# Patient Record
Sex: Female | Born: 1965 | Race: White | Hispanic: No | Marital: Single | Smoking: Current every day smoker
Health system: Southern US, Community
[De-identification: ages and names within clinical notes are randomized; demographics above are authoritative.]

## PROBLEM LIST (undated history)

## (undated) DIAGNOSIS — K219 Gastro-esophageal reflux disease without esophagitis: Secondary | ICD-10-CM

## (undated) DIAGNOSIS — E079 Disorder of thyroid, unspecified: Secondary | ICD-10-CM

## (undated) DIAGNOSIS — K625 Hemorrhage of anus and rectum: Secondary | ICD-10-CM

## (undated) DIAGNOSIS — Z86010 Personal history of colonic polyps: Secondary | ICD-10-CM

## (undated) DIAGNOSIS — J988 Other specified respiratory disorders: Secondary | ICD-10-CM

## (undated) HISTORY — PX: COLON SURGERY: SHX602

## (undated) HISTORY — DX: Disorder of thyroid, unspecified: E07.9

## (undated) HISTORY — DX: Hemorrhage of anus and rectum: K62.5

## (undated) HISTORY — DX: Personal history of colonic polyps: Z86.010

## (undated) HISTORY — PX: TUBAL LIGATION: SHX77

---

## 1995-08-09 HISTORY — PX: OOPHORECTOMY: SHX86

## 1998-08-28 ENCOUNTER — Other Ambulatory Visit: Admission: RE | Admit: 1998-08-28 | Discharge: 1998-08-28 | Payer: Self-pay | Admitting: Obstetrics and Gynecology

## 1999-08-30 ENCOUNTER — Other Ambulatory Visit: Admission: RE | Admit: 1999-08-30 | Discharge: 1999-08-30 | Payer: Self-pay | Admitting: Obstetrics and Gynecology

## 2000-08-18 ENCOUNTER — Other Ambulatory Visit: Admission: RE | Admit: 2000-08-18 | Discharge: 2000-08-18 | Payer: Self-pay | Admitting: Obstetrics and Gynecology

## 2001-09-04 ENCOUNTER — Other Ambulatory Visit: Admission: RE | Admit: 2001-09-04 | Discharge: 2001-09-04 | Payer: Self-pay | Admitting: Obstetrics and Gynecology

## 2002-09-10 ENCOUNTER — Other Ambulatory Visit: Admission: RE | Admit: 2002-09-10 | Discharge: 2002-09-10 | Payer: Self-pay | Admitting: Obstetrics and Gynecology

## 2003-09-15 ENCOUNTER — Other Ambulatory Visit: Admission: RE | Admit: 2003-09-15 | Discharge: 2003-09-15 | Payer: Self-pay | Admitting: Obstetrics and Gynecology

## 2004-09-21 ENCOUNTER — Other Ambulatory Visit: Admission: RE | Admit: 2004-09-21 | Discharge: 2004-09-21 | Payer: Self-pay | Admitting: Obstetrics and Gynecology

## 2004-09-23 ENCOUNTER — Encounter: Admission: RE | Admit: 2004-09-23 | Discharge: 2004-09-23 | Payer: Self-pay | Admitting: Obstetrics and Gynecology

## 2005-09-21 ENCOUNTER — Other Ambulatory Visit: Admission: RE | Admit: 2005-09-21 | Discharge: 2005-09-21 | Payer: Self-pay | Admitting: Obstetrics and Gynecology

## 2006-09-27 ENCOUNTER — Encounter: Admission: RE | Admit: 2006-09-27 | Discharge: 2006-09-27 | Payer: Self-pay | Admitting: Obstetrics and Gynecology

## 2007-10-03 ENCOUNTER — Encounter: Admission: RE | Admit: 2007-10-03 | Discharge: 2007-10-03 | Payer: Self-pay | Admitting: Obstetrics and Gynecology

## 2008-10-02 ENCOUNTER — Encounter: Admission: RE | Admit: 2008-10-02 | Discharge: 2008-10-02 | Payer: Self-pay | Admitting: Obstetrics and Gynecology

## 2009-10-07 ENCOUNTER — Encounter: Admission: RE | Admit: 2009-10-07 | Discharge: 2009-10-07 | Payer: Self-pay | Admitting: Obstetrics and Gynecology

## 2010-09-02 ENCOUNTER — Other Ambulatory Visit: Payer: Self-pay | Admitting: Obstetrics and Gynecology

## 2010-09-02 DIAGNOSIS — Z1239 Encounter for other screening for malignant neoplasm of breast: Secondary | ICD-10-CM

## 2010-10-13 ENCOUNTER — Ambulatory Visit
Admission: RE | Admit: 2010-10-13 | Discharge: 2010-10-13 | Disposition: A | Discharge status: Home / Self Care | Payer: Private Health Insurance - Indemnity | Source: Ambulatory Visit | Attending: Obstetrics and Gynecology | Admitting: Obstetrics and Gynecology

## 2010-10-13 DIAGNOSIS — Z1239 Encounter for other screening for malignant neoplasm of breast: Secondary | ICD-10-CM

## 2011-02-18 ENCOUNTER — Encounter (INDEPENDENT_AMBULATORY_CARE_PROVIDER_SITE_OTHER): Payer: Self-pay | Admitting: General Surgery

## 2011-02-18 ENCOUNTER — Ambulatory Visit (INDEPENDENT_AMBULATORY_CARE_PROVIDER_SITE_OTHER): Payer: Private Health Insurance - Indemnity | Admitting: General Surgery

## 2011-02-18 VITALS — BP 122/88 | HR 64 | Temp 97.8°F | Ht 67.0 in | Wt 141.0 lb

## 2011-02-18 DIAGNOSIS — K6389 Other specified diseases of intestine: Secondary | ICD-10-CM

## 2011-02-18 NOTE — Progress Notes (Signed)
Subjective:     Patient ID: Kendra Fuller, female   DOB: 1965/10/22, 45 y.o.   MRN: [id number]    BP 122/88  Pulse 64  Temp(Src) 97.8 F (36.6 C) (Temporal)  Ht 5\' 7"  (1.702 m)  Wt 141 lb (63.957 kg)  BMI 22.08 kg/m2    HPI This patient is referred for evaluation of a sigmoid colon mass which was found on recent colonoscopy. She has complained of 7 months of rectal bleeding which she states is bright red blood on the stool but has recently been increased last 3 months. She was evaluated by Dr. Bosie Clos at [facility] who performed a colonoscopy on February 11, 2011 which demonstrated a large sigmoid mass at approximately 35 cm. 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 She was referred for surgical evaluation and removal of this mass which was unresectable by endoscopy. She denies any abdominal pain, weight loss, or family history of colon cancer. She states that her bowels have otherwise been normal and is normally approximately one bowel movement per day with some occasional episodes of constipation. She denies any nausea, vomiting, fevers, or chills or other constitutional symptoms.  No Known Allergies  Past Medical History  Diagnosis Date  . Rectal bleeding     Past Surgical History  Procedure Date  . Oophorectomy   . Tubal ligation     left fallopian tube removed    History  Substance Use Topics  . Smoking status: Current Everyday Smoker -- 1.0 packs/day    Types: Cigarettes  . Smokeless tobacco: Current User  . Alcohol Use: Yes    Family History  Problem Relation Age of Onset  . Cancer Mother     lung cancer  . Heart disease Father      Review of Systems  Constitutional: Negative.   Eyes: Negative.   Respiratory: Negative.   Cardiovascular: Negative.   Gastrointestinal: Positive for blood in stool and anal bleeding.  Genitourinary: Negative.   Musculoskeletal: Negative.   Skin: Negative.     Neurological: Negative.   Hematological: Negative.   Psychiatric/Behavioral: Negative.        Objective:   Physical Exam  Constitutional: She is oriented to person, place, and time. She appears well-developed and well-nourished. No distress.  HENT:  Head: Normocephalic and atraumatic.  Mouth/Throat: No oropharyngeal exudate.  Eyes: Conjunctivae and EOM are normal. Pupils are equal, round, and reactive to light. Right eye exhibits no discharge. Left eye exhibits no discharge. No scleral icterus.  Neck: Normal range of motion. Neck supple. No tracheal deviation present.  Cardiovascular: Normal rate, regular rhythm, normal heart sounds and intact distal pulses.   Pulmonary/Chest: Effort normal and breath sounds normal. No stridor. No respiratory distress. She has no wheezes. She has no rales.  Abdominal: Soft. Bowel sounds are normal. She exhibits no distension and no mass. There is no tenderness. There is no rebound and no guarding.  Genitourinary:       Rectal with some external skin tags, hemorrhoids.  Good rectal tone, no internal masses  Musculoskeletal: Normal range of motion. She exhibits no edema and no tenderness.  Neurological: She is oriented to person, place, and time. She has normal reflexes.  Skin: Skin is warm and dry. No rash noted. She is not diaphoretic. No erythema. No pallor.  Psychiatric: She has a normal mood and affect. Her behavior is normal. Judgment and thought content normal.       Assessment:  Rectal bleeding with Colon mass    Plan:     I discussed with her the pathology results and reviewed her endoscopy images showing the adenomatous polyp. This does appear to be too large for endoscopic resection and I agree with the plan for formal colon resection. We discussed the options of open versus laparoscopic procedure and if her anatomy is amenable to colonoscopic repair this would probably be best for her. However, she does have a lower midline scar from  her prior surgery which may limit our ability for the laparoscopic procedure. I discussed with her the risks of infection, bleeding, scar, anastomotic leaks, ureteral injury, injury to spleen or surrounding structures, need for open surgery, possible need for ostomy. She expressed understanding and desires to proceed with the planned procedure. Prior to her procedure we will give her 2 fleets enemas the morning of her surgery. I have also counseled her to start liquids for 2 days prior to surgery. We will also obtain laboratory studies including a CEA as well as a CT of the abdomen.

## 2011-02-23 ENCOUNTER — Ambulatory Visit (HOSPITAL_COMMUNITY)
Admission: RE | Admit: 2011-02-23 | Discharge: 2011-02-23 | Disposition: A | Discharge status: Home / Self Care | Payer: Managed Care, Other (non HMO) | Source: Ambulatory Visit | Attending: General Surgery | Admitting: General Surgery

## 2011-02-23 DIAGNOSIS — K6389 Other specified diseases of intestine: Secondary | ICD-10-CM | POA: Insufficient documentation

## 2011-02-23 DIAGNOSIS — R933 Abnormal findings on diagnostic imaging of other parts of digestive tract: Secondary | ICD-10-CM | POA: Insufficient documentation

## 2011-02-23 MED ORDER — IOHEXOL 300 MG/ML  SOLN
100.0000 mL | Freq: Once | INTRAMUSCULAR | Status: AC | PRN
  Administered 2011-02-23: 100 mL via INTRAVENOUS

## 2011-02-25 ENCOUNTER — Other Ambulatory Visit (INDEPENDENT_AMBULATORY_CARE_PROVIDER_SITE_OTHER): Payer: Self-pay | Admitting: General Surgery

## 2011-02-25 DIAGNOSIS — K6389 Other specified diseases of intestine: Secondary | ICD-10-CM

## 2011-02-25 DIAGNOSIS — K769 Liver disease, unspecified: Secondary | ICD-10-CM

## 2011-03-02 ENCOUNTER — Other Ambulatory Visit (INDEPENDENT_AMBULATORY_CARE_PROVIDER_SITE_OTHER): Payer: Self-pay | Admitting: General Surgery

## 2011-03-02 ENCOUNTER — Encounter (HOSPITAL_COMMUNITY)
Admission: RE | Admit: 2011-03-02 | Discharge: 2011-03-02 | Disposition: A | Discharge status: Home / Self Care | Payer: Managed Care, Other (non HMO) | Source: Ambulatory Visit | Attending: General Surgery | Admitting: General Surgery

## 2011-03-02 ENCOUNTER — Ambulatory Visit (HOSPITAL_COMMUNITY)
Admission: RE | Admit: 2011-03-02 | Discharge: 2011-03-02 | Disposition: A | Discharge status: Home / Self Care | Payer: Managed Care, Other (non HMO) | Source: Ambulatory Visit | Attending: General Surgery | Admitting: General Surgery

## 2011-03-02 DIAGNOSIS — K6389 Other specified diseases of intestine: Secondary | ICD-10-CM

## 2011-03-02 DIAGNOSIS — Z01812 Encounter for preprocedural laboratory examination: Secondary | ICD-10-CM | POA: Insufficient documentation

## 2011-03-02 DIAGNOSIS — D126 Benign neoplasm of colon, unspecified: Secondary | ICD-10-CM | POA: Insufficient documentation

## 2011-03-02 DIAGNOSIS — K769 Liver disease, unspecified: Secondary | ICD-10-CM

## 2011-03-02 DIAGNOSIS — K7689 Other specified diseases of liver: Secondary | ICD-10-CM | POA: Insufficient documentation

## 2011-03-02 DIAGNOSIS — K639 Disease of intestine, unspecified: Secondary | ICD-10-CM | POA: Insufficient documentation

## 2011-03-02 DIAGNOSIS — Z01810 Encounter for preprocedural cardiovascular examination: Secondary | ICD-10-CM | POA: Insufficient documentation

## 2011-03-02 DIAGNOSIS — Z01818 Encounter for other preprocedural examination: Secondary | ICD-10-CM | POA: Insufficient documentation

## 2011-03-02 LAB — SURGICAL PCR SCREEN
MRSA, PCR: NEGATIVE
Staphylococcus aureus: NEGATIVE

## 2011-03-02 LAB — CBC
HCT: 42 % (ref 36.0–46.0)
Hemoglobin: 14.8 g/dL (ref 12.0–15.0)
MCH: 30.5 pg (ref 26.0–34.0)
MCHC: 35.2 g/dL (ref 30.0–36.0)
MCV: 86.6 fL (ref 78.0–100.0)
Platelets: 246 10*3/uL (ref 150–400)
RBC: 4.85 MIL/uL (ref 3.87–5.11)
RDW: 12.9 % (ref 11.5–15.5)
WBC: 11.2 10*3/uL — ABNORMAL HIGH (ref 4.0–10.5)

## 2011-03-02 LAB — COMPREHENSIVE METABOLIC PANEL
ALT: 13 U/L (ref 0–35)
AST: 17 U/L (ref 0–37)
Albumin: 3.9 g/dL (ref 3.5–5.2)
Alkaline Phosphatase: 77 U/L (ref 39–117)
BUN: 6 mg/dL (ref 6–23)
CO2: 27 mEq/L (ref 19–32)
Calcium: 9.4 mg/dL (ref 8.4–10.5)
Chloride: 102 mEq/L (ref 96–112)
Creatinine, Ser: 0.57 mg/dL (ref 0.50–1.10)
GFR calc Af Amer: >60 mL/min (ref >60)
GFR calc non Af Amer: >60 mL/min (ref >60)
Glucose, Bld: 90 mg/dL (ref 70–99)
Potassium: 3.7 mEq/L (ref 3.5–5.1)
Sodium: 137 mEq/L (ref 135–145)
Total Bilirubin: 0.2 mg/dL — ABNORMAL LOW (ref 0.3–1.2)
Total Protein: 7.1 g/dL (ref 6.0–8.3)

## 2011-03-02 LAB — DIFFERENTIAL
Basophils Absolute: 0 10*3/uL (ref 0.0–0.1)
Basophils Relative: 0 % (ref 0–1)
Eosinophils Absolute: 0.2 10*3/uL (ref 0.0–0.7)
Eosinophils Relative: 2 % (ref 0–5)
Lymphocytes Relative: 24 % (ref 12–46)
Lymphs Abs: 2.7 10*3/uL (ref 0.7–4.0)
Monocytes Absolute: 0.9 10*3/uL (ref 0.1–1.0)
Monocytes Relative: 8 % (ref 3–12)
Neutro Abs: 7.3 10*3/uL (ref 1.7–7.7)
Neutrophils Relative %: 65 % (ref 43–77)

## 2011-03-02 LAB — PROTIME-INR
INR: 0.93 (ref 0.00–1.49)
Prothrombin Time: 12.7 seconds (ref 11.6–15.2)

## 2011-03-02 LAB — CEA: CEA: 3.1 ng/mL (ref 0.0–5.0)

## 2011-03-02 LAB — HCG, SERUM, QUALITATIVE: Preg, Serum: NEGATIVE

## 2011-03-02 LAB — APTT: aPTT: 28 seconds (ref 24–37)

## 2011-03-02 MED ORDER — GADOBENATE DIMEGLUMINE 529 MG/ML IV SOLN
14.0000 mL | Freq: Once | INTRAVENOUS | Status: AC | PRN
  Administered 2011-03-02: 14 mL via INTRAVENOUS

## 2011-03-08 ENCOUNTER — Other Ambulatory Visit (INDEPENDENT_AMBULATORY_CARE_PROVIDER_SITE_OTHER): Payer: Self-pay | Admitting: General Surgery

## 2011-03-08 ENCOUNTER — Inpatient Hospital Stay (HOSPITAL_COMMUNITY)
Admission: RE | Admit: 2011-03-08 | Discharge: 2011-03-12 | DRG: 331 | Disposition: A | Discharge status: Home / Self Care | Payer: Managed Care, Other (non HMO) | Source: Ambulatory Visit | Attending: General Surgery | Admitting: General Surgery

## 2011-03-08 DIAGNOSIS — D126 Benign neoplasm of colon, unspecified: Principal | ICD-10-CM | POA: Diagnosis present

## 2011-03-08 DIAGNOSIS — F17200 Nicotine dependence, unspecified, uncomplicated: Secondary | ICD-10-CM | POA: Diagnosis present

## 2011-03-08 HISTORY — PX: LAPAROSCOPIC SIGMOID COLECTOMY: SHX5928

## 2011-03-09 LAB — BASIC METABOLIC PANEL
BUN: 6 mg/dL (ref 6–23)
CO2: 25 mEq/L (ref 19–32)
Calcium: 8 mg/dL — ABNORMAL LOW (ref 8.4–10.5)
Chloride: 105 mEq/L (ref 96–112)
Creatinine, Ser: 0.49 mg/dL — ABNORMAL LOW (ref 0.50–1.10)
GFR calc Af Amer: >60 mL/min (ref >60)
GFR calc non Af Amer: >60 mL/min (ref >60)
Glucose, Bld: 99 mg/dL (ref 70–99)
Potassium: 4 mEq/L (ref 3.5–5.1)
Sodium: 138 mEq/L (ref 135–145)

## 2011-03-09 LAB — CBC
HCT: 32.9 % — ABNORMAL LOW (ref 36.0–46.0)
Hemoglobin: 11.4 g/dL — ABNORMAL LOW (ref 12.0–15.0)
MCH: 30.6 pg (ref 26.0–34.0)
MCHC: 34.7 g/dL (ref 30.0–36.0)
MCV: 88.2 fL (ref 78.0–100.0)
Platelets: 174 10*3/uL (ref 150–400)
RBC: 3.73 MIL/uL — ABNORMAL LOW (ref 3.87–5.11)
RDW: 13.3 % (ref 11.5–15.5)
WBC: 15.1 10*3/uL — ABNORMAL HIGH (ref 4.0–10.5)

## 2011-03-09 LAB — HEMOGLOBIN AND HEMATOCRIT, BLOOD
HCT: 33.4 % — ABNORMAL LOW (ref 36.0–46.0)
Hemoglobin: 11.5 g/dL — ABNORMAL LOW (ref 12.0–15.0)

## 2011-03-19 NOTE — Op Note (Signed)
[name], FLIGHT                ACCOUNT NO.:  [id number]  MEDICAL RECORD NO.:  [id number]  LOCATION:  5155                         FACILITY:  [facility]  PHYSICIAN:  Lodema Pilot, MD       DATE OF BIRTH:  Oct 09, 1965  DATE OF PROCEDURE:  03/08/2011 DATE OF DISCHARGE:                              OPERATIVE REPORT   PROCEDURE:  Laparoscopic left colectomy.  PREOPERATIVE DIAGNOSIS:  Colon mass.  POSTOPERATIVE DIAGNOSIS:  Colon mass.  SURGEON:  Lodema Pilot, MD  ASSISTANTS: 1. Almond Lint, MD 2. Dr. Andrey Campanile.  ANESTHESIA:  General endotracheal anesthesia with 30 mL of 1% lidocaine with epinephrine and 0.25% Marcaine injected at 50/50 mixture.  FLUIDS:  3400 mL of the crystalloid, 1000 mL of colloid.  ESTIMATED BLOOD LOSS:  150 mL.  DRAINS:  None.  SPECIMENS:  Splenic flexure of the colon.  COMPLICATIONS:  None apparent.  FINDINGS:  Splenic flexure tattoo with underlying splenic flexure colon mass, primary side-to-side functional end-to-end anastomosis, a stapled anastomosis.  No evidence of liver lesion.  INDICATIONS FOR PROCEDURE:  Kendra Fuller is a 45 year old female who underwent a colonoscopy for her rectal bleeding.  She was found to have unresectable colon mass with pathology consistent with an adenomatous polyp which was tattooed and felt to be around 35 cm on the scope.  She was consented for laparoscopic sigmoid colectomy for removal of the polyp.  OPERATIVE DETAILS:  Kendra Fuller was seen and evaluated in the preoperative area and risks and benefits of the procedure were again discussed in lay terms.  Informed consent was obtained.  She had been on a liquid diet for 2 days and no bowel prep was administered.  She was given prophylactic antibiotics as well as Lovenox preoperatively.  She was taken to the operating room, placed on the table in supine position, and general endotracheal anesthesia was obtained.  Her right arm was prepped and her legs were placed in  Yellowfin stirrups, and Foley catheter was placed. Her abdomen was prepped and draped in a standard surgical fashion.  A supraumbilical midline incision was made in the skin and dissection was carried down to the abdominal wall fascia using blunt dissection and the abdominal fascia was sharply incised, the peritoneum was elevated and sharply incised and a 12-mm Hasson trocar was placed into the abdomen.  Pneumoperitoneum was obtained and laparoscope was introduced.  She did not have any evidence of liver lesions and the visible portions of the liver.  She did have some lower midline adhesions to the abdominal wall from her prior pelvic surgery and a 5-mm left lower quadrant trocar was placed under direct visualization.  Using this trocar, adhesions were taken down using sharp dissection and some of the omentum was taken down using a LigaSure device.  There was a loop of bowel which was closely adherent to the abdominal wall which was taken down and so another 5-mm right lower quadrant trocar was placed under direct visualization to allow for retraction and lysis of adhesions.  Adhesions were taken down and the underlying bowel was inspected for any injury, and there was some serosal cautery artifact on the serosa of the loop of small  bowel and one small spot in this and there was no evidence of bowel injury.  I oversewed this area with three interrupted silk Lembert sutures later in the procedure prophylactically.  With the adhesions all taken down, I placed a 12-mm trocar through her prior midline incision in the suprapubic region and the patient was positioned and the left colon was inspected for a tattoo.  It appeared to be much higher than the sigmoid colon and that was near the splenic flexure but there was a clear tattoo in this region.  The left colic vessels were identified through the mesenteric and the peritoneum was taken down over these vessels as they took off from the  inferior mesenteric artery.  The left colic and splenic arteries were identified, and they were skeletonized near their origins.  Prior to dividing any vascular structures, the left ureter was identified in the standard position and with the ureter identified, I felt comfortable dividing the rest of the left colic artery which was supplying the area of the tumor.  I felt that the sigmoid vessels were not necessary to divide in order to maintain blood flow to her distal colon and allow for perfusion at her anastomosis.  I divided the left colic branches using the LigaSure device and then blunt dissection was performed superficial to the Toldt fascia.  We dissected up to the mesentery using blunt dissection, staying above the kidney.  The tail of the pancreas was identified and after mesenteric dissection was performed in a standard medial collateral fashion, I felt that it will be started on the lateral dissection taking it down the peritoneum along the white line of Toldt using a sharp dissection and a minimal amount of Bovie electrocautery.  The colon was mobilized well from lateral to medial fashion, however, the splenic flexure was very cephalad and was adherent to the hilum of the spleen.  LigaSure was used to divide the colonic attachments to the spleen and the omentum was taken off the hepatic flexure and transverse colon using sharp dissection and LigaSure device, taking care to avoid injury to the colon.  After we felt that we had adequate medial mobilization of hepatic flexure and sufficient amount of colon for anastomosis after our resected specimen, I enlarged the supraumbilical trocar site to allow for a wound retractor and the colon was pulled up into the wound, however, there were some attachments still in the area of the splenic flexure which prevented adequate mobilization in order to do the resection and anastomosis, so the GelPOINT was placed through this incision and  further mobilization of the splenic flexure was performed.  The mesentery was divided up to the middle colic artery and after further mobilization, the splenic flexure of the colon was again exteriorized at this time, allowing for adequate mobilization.  A mesenteric window was created at least 5 cm from the palpable tumor and distal margin of the colon was transected with the GIA-75 blue load stapler.  The proximal colon was divided again at 5 cm from mass using another blue load GIA-75 stapler and the mesentery was already nearly divided which had been done laparoscopically but LigaSure device was used to complete the mesenteric transection.  The colon was opened up on the back table and indeed in the area of the tattoo, there was a pedunculated polyp which appeared to have adequate margins and this was passed off the table and sent to pathology for permanent sectioning.  Gloves were changed for fresh gloves and the  two ends of the colon were opened up and aligned along the tenia on the antimesenteric border and a side-to-side functional end-to-end anastomosis was created with another firing of the GIA-75 stapler.  The anastomosis was inspected internally for hemostasis which was noted to be adequate.  Staples appeared to be well formed.  A 3-0 silk suture was placed at the crotch of the staple line and the colotomy was closed with TA-60 blue load staple firing.  The staple line was oversewed with interrupted 3-0 silk Lembert sutures and epiploic appendages were sutured over the staple line.  The small bowel was eviscerated through a wound retractor starting at the terminal ileum and the small bowel was inspected for any injury and again in the area, there was some cautery artifact on the serosa.  Interrupted 3-0 silk Lembert sutures were replaced in this one area although there did not appear to be any full- thickness burn.  The entire small bowel was run to evaluate for any bowel  injury and none was identified.  The small bowel was replaced into the abdomen and the abdomen was irrigated with sterile saline solution until the irrigation returned clear.  The abdomen was again insufflated and the anastomosis was inspected to ensure that the limbs of the colon were not twisted during the completion of the anastomosis.  There was no evidence of bleeding from the spleen and again the anastomosis appeared to be intact.  The patient abdomen was again suctioned from any excess fluid and the trocars were removed under direct visualization.  The 12- mm suprapubic trocar fascia was approximated with 0 Vicryl suture and the fascia at the midline from our extraction site was approximated with #1 looped PDS x2 taking care to avoid injury to underlying bowel contents.  With the fascia approximated, the wound was irrigated with sterile saline solution and the wounds were noted to be hemostatic.  A couple of 3-0 Vicryl sutures were used to approximate the dermis around the umbilicus to ensure good cosmesis and the skin edges were approximated with 4-0 Monocryl subcuticular suture and all skin incisions.  Skin was washed, dried, and Mastisol and Steri-Strips were applied.  Sterile dressings were applied.  All sponge, needle, instrument counts were correct at the end of the case and the patient was stable and ready for transfer to the recovery room in stable condition.          ______________________________ Lodema Pilot, MD     BL/MEDQ  D:  03/08/2011  T:  03/09/2011  Job:  [id number]  Electronically Signed by Lodema Pilot DO on 03/19/2011 06:02:14 PM

## 2011-03-22 ENCOUNTER — Ambulatory Visit (INDEPENDENT_AMBULATORY_CARE_PROVIDER_SITE_OTHER): Payer: Managed Care, Other (non HMO) | Admitting: General Surgery

## 2011-03-22 ENCOUNTER — Encounter (INDEPENDENT_AMBULATORY_CARE_PROVIDER_SITE_OTHER): Payer: Self-pay

## 2011-03-22 VITALS — BP 128/84 | HR 70 | Temp 98.8°F | Resp 18

## 2011-03-22 DIAGNOSIS — Z4889 Encounter for other specified surgical aftercare: Secondary | ICD-10-CM

## 2011-03-22 DIAGNOSIS — Z5189 Encounter for other specified aftercare: Secondary | ICD-10-CM

## 2011-03-22 NOTE — Patient Instructions (Signed)
Follow up with Dr. Bosie Clos for scheduling repeat colonoscopy per his recommendation.

## 2011-03-22 NOTE — Progress Notes (Signed)
Subjective:     Patient ID: Kendra Fuller, female   DOB: 1965-11-17, 45 y.o.   MRN: [id number]  HPI Kendra Fuller is 2 weeks status post laparoscopic left partial colectomy for unresectable adenoma on colonoscopy. She states that she's been doing very well and has no complaints. She is off pain medication and has been moving her bowels without difficulty. She denies any blood in the stools and has been tolerating regular diet. She does state that her appetite has been down as compared to preop. Her only complaint was her right arm tenderness at the site of her pneumonia shot received and while in the hospital. Pathology was reviewed and is benign.  Review of Systems     Objective:   Physical Exam She is in no acute distress and nontoxic-appearing  Her abdomen is soft, nontender, nondistended, her incisions are healing well without any sign of infection.    Assessment:     Status post laparoscopic left colectomy, pathology benign, doing very well.    Plan:     She is doing well from my standpoint and can return to activity as tolerated. She asked that she could return to work and I think that this would be reasonable. I recommended that she follow up with Dr. Bosie Clos per his recommendations for repeat colonoscopy. Otherwise she can follow up with me on a p.r.n. basis.

## 2011-04-21 NOTE — Discharge Summary (Signed)
  NAMEHIDEKO, ESSELMAN                ACCOUNT NO.:  [id number]  MEDICAL RECORD NO.:  [id number]  LOCATION:  5155                         FACILITY:  [facility]  PHYSICIAN:  Lodema Pilot, MD       DATE OF BIRTH:  06/05/1966  DATE OF ADMISSION:  03/08/2011 DATE OF DISCHARGE:  03/12/2011                              DISCHARGE SUMMARY   ADMITTING DIAGNOSIS:  Recurrent diverticulitis.  DISCHARGE DIAGNOSIS:  Recurrent diverticulitis.  ADMITTING SURGEON:  Lodema Pilot, MD, General Surgery Service.  PRINCIPAL PROCEDURES PERFORMED:  Laparoscopic left hemicolectomy performed on March 08, 2011.  HISTORY OF PRESENT ILLNESS:  Please refer to history and physical on the chart.  HOSPITAL COURSE:  Ms. Krzywicki was admitted to the hospital on March 08, 2011, for an elective procedure for treatment of recurrent diverticulitis.  She was taken to the operating room on March 08, 2011, for a laparoscopic left hemicolectomy, please refer to the dictated operative note from the same day.  The procedure was well tolerated, and she was transferred to the surgical ward on postop day zero.  On postoperative day #2, we began slowly advancing her diet, which was tolerated without difficulty.  On postoperative day #3, she was advanced to regular diet, which she tolerated.  She was passing flatus, bowels functioning, and she was ready for discharge home on postoperative day #4.  DISCHARGE INSTRUCTIONS:  She was instructed to follow up in my clinic in 2-3 weeks for repeat evaluation.  She was instructed to return to the emergency room for any increasing abdominal pain, fevers, chills, redness, or nausea and vomiting.          ______________________________ Lodema Pilot, MD     BL/MEDQ  D:  04/19/2011  T:  04/19/2011  Job:  [id number]  Electronically Signed by Lodema Pilot DO on 04/21/2011 01:56:36 PM

## 2012-07-23 ENCOUNTER — Other Ambulatory Visit: Payer: Self-pay | Admitting: Obstetrics and Gynecology

## 2012-12-20 ENCOUNTER — Encounter (INDEPENDENT_AMBULATORY_CARE_PROVIDER_SITE_OTHER): Payer: Managed Care, Other (non HMO) | Admitting: General Surgery

## 2013-02-07 ENCOUNTER — Encounter (INDEPENDENT_AMBULATORY_CARE_PROVIDER_SITE_OTHER): Payer: Managed Care, Other (non HMO) | Admitting: General Surgery

## 2013-04-17 ENCOUNTER — Ambulatory Visit (INDEPENDENT_AMBULATORY_CARE_PROVIDER_SITE_OTHER): Payer: Managed Care, Other (non HMO) | Admitting: General Surgery

## 2013-04-17 ENCOUNTER — Encounter (INDEPENDENT_AMBULATORY_CARE_PROVIDER_SITE_OTHER): Payer: Self-pay | Admitting: General Surgery

## 2013-04-17 VITALS — BP 130/80 | HR 92 | Resp 14 | Ht 67.0 in | Wt 157.2 lb

## 2013-04-17 DIAGNOSIS — K432 Incisional hernia without obstruction or gangrene: Secondary | ICD-10-CM

## 2013-04-17 NOTE — Progress Notes (Signed)
Patient ID: GENEVIVE PRINTUP, female   DOB: [birth year], 47 y.o.   MRN: [id number]  Chief Complaint  Patient presents with  . New Evaluation    est patient new problem    HPI Kendra Fuller is a 47 y.o. female who presents for evaluation of incisional hernia. She is known to me for prior sigmoid colectomy.  She noticed a small outpouching in December-January at the upper part of her incision while she was exercising and lifting weights. She was seen by her gynecologist Dr. Dareen Piano in February who diagnosed her with incisional hernia and recommended that she stop lifting weights and stop exercising vigorously. She has been walking for exercise. She feels like it has increased in size since then. She denies any associated pain. She has a bowel movement every other day. No nausea no vomiting. Eating ok.   HPI  Past Medical History  Diagnosis Date  . Rectal bleeding     Past Surgical History  Procedure Laterality Date  . Oophorectomy    . Tubal ligation      left fallopian tube removed    Family History  Problem Relation Age of Onset  . Cancer Mother     lung cancer  . Heart disease Father     Social History History  Substance Use Topics  . Smoking status: Current Every Day Smoker -- 1.00 packs/day    Types: Cigarettes  . Smokeless tobacco: Never Used  . Alcohol Use: No    No Known Allergies  Current Outpatient Prescriptions  Medication Sig Dispense Refill  . loratadine (CLARITIN) 10 MG tablet Take 10 mg by mouth daily.         No current facility-administered medications for this visit.    Review of Systems Review of Systems All other review of systems negative or noncontributory except as stated in the HPI  Blood pressure 130/80, pulse 92, resp. rate 14, height 5\' 7"  (1.702 m), weight 157 lb 3.2 oz (71.305 kg).  Physical Exam Physical Exam Physical Exam  Nursing note and vitals reviewed. Constitutional: She is oriented to person, place, and time. She appears  well-developed and well-nourished. No distress.  HENT:  Head: Normocephalic and atraumatic.  Mouth/Throat: No oropharyngeal exudate.  Eyes: Conjunctivae and EOM are normal. Pupils are equal, round, and reactive to light. Right eye exhibits no discharge. Left eye exhibits no discharge. No scleral icterus.  Neck: Normal range of motion. Neck supple. No tracheal deviation present.  Cardiovascular: Normal rate, regular rhythm, normal heart sounds and intact distal pulses.   Pulmonary/Chest: Effort normal and breath sounds normal. No stridor. No respiratory distress. She has no wheezes.  Abdominal: Soft. Bowel sounds are normal. She exhibits no distension and no mass. There is no tenderness. There is no rebound and no guarding. Moderate sized bulge just to the left of her prior incision and umbilicus, it is reducible. Musculoskeletal: Normal range of motion. She exhibits no edema and no tenderness.  Neurological: She is alert and oriented to person, place, and time.  Skin: Skin is warm and dry. No rash noted. She is not diaphoretic. No erythema. No pallor.  Psychiatric: She has a normal mood and affect. Her behavior is normal. Judgment and thought content normal.    Data Reviewed   Assessment    Incisional hernia I think that this is most likely an consistent with an incisional hernia. It is relatively asymptomatic at this time but she would like to have this repaired. I discussed with her the  options for open and laparoscopic repair as well as CT scans to evaluate defect or 4 other possible defects. She refuses a CT scan at this time but would like to plan for surgery. I think that laparoscopic repair would be a good option for her.  We did discuss the procedure and its risks including infection, bleeding, pain, scarring, recurrence,  Persistent bulge, seroma, poor cosmesis, bowel injury, and need for open surgery and she expressed understanding and would like to proceed with laparoscopic incisional  hernia repair with mesh.     Plan    We will go ahead and set her up for laparoscopic incisional hernia repair with mesh. I think that if we do this laparoscopic then we can work forego the CT as we will be able to see the remainder of the incision and any other defects if present.        Lodema Pilot DAVID 04/17/2013, 4:28 PM

## 2013-04-18 ENCOUNTER — Telehealth (INDEPENDENT_AMBULATORY_CARE_PROVIDER_SITE_OTHER): Payer: Self-pay | Admitting: General Surgery

## 2013-04-18 NOTE — Telephone Encounter (Signed)
Went over pt financial responsibilities pt will call back to schedule her surgery. Placed in pending folder

## 2013-08-14 ENCOUNTER — Ambulatory Visit (INDEPENDENT_AMBULATORY_CARE_PROVIDER_SITE_OTHER): Payer: Managed Care, Other (non HMO) | Admitting: General Practice

## 2013-08-14 ENCOUNTER — Telehealth: Payer: Self-pay | Admitting: Family Medicine

## 2013-08-14 ENCOUNTER — Encounter: Payer: Self-pay | Admitting: General Practice

## 2013-08-14 VITALS — BP 150/90 | HR 101 | Temp 97.2°F | Ht 67.0 in | Wt 161.0 lb

## 2013-08-14 DIAGNOSIS — G4700 Insomnia, unspecified: Secondary | ICD-10-CM

## 2013-08-14 DIAGNOSIS — J209 Acute bronchitis, unspecified: Secondary | ICD-10-CM

## 2013-08-14 DIAGNOSIS — R062 Wheezing: Secondary | ICD-10-CM

## 2013-08-14 MED ORDER — ALBUTEROL SULFATE (2.5 MG/3ML) 0.083% IN NEBU
2.5000 mg | INHALATION_SOLUTION | Freq: Once | RESPIRATORY_TRACT | Status: AC
  Administered 2013-08-14: 2.5 mg via RESPIRATORY_TRACT

## 2013-08-14 MED ORDER — TRAZODONE HCL 50 MG PO TABS: ORAL_TABLET | ORAL | Status: DC

## 2013-08-14 MED ORDER — METHYLPREDNISOLONE ACETATE 80 MG/ML IJ SUSP
80.0000 mg | Freq: Once | INTRAMUSCULAR | Status: AC
Start: 2013-08-14 — End: 2013-08-14
  Administered 2013-08-14: 80 mg via INTRAMUSCULAR

## 2013-08-14 MED ORDER — PREDNISONE (PAK) 10 MG PO TABS: ORAL_TABLET | ORAL | Status: DC

## 2013-08-14 NOTE — Patient Instructions (Addendum)

## 2013-08-14 NOTE — Progress Notes (Signed)
   Subjective:    Patient ID: Kendra Fuller, female    DOB: 27-Jan-1966, 48 y.o.   MRN: [id number]  Sinusitis This is a new problem. The current episode started in the past 7 days. The problem has been gradually worsening since onset. Maximum temperature: unmeasured fever. The fever has been present for less than 1 day. Associated symptoms include chills, congestion, coughing and sinus pressure. Pertinent negatives include no shortness of breath or sore throat. Past treatments include lying down and oral decongestants.  Cough This is a new problem. The current episode started 1 to 4 weeks ago (onset 3 weeks ago). The cough is non-productive. Associated symptoms include chills, a fever, nasal congestion and wheezing. Pertinent negatives include no chest pain, sore throat or shortness of breath. The symptoms are aggravated by lying down. Risk factors for lung disease include smoking/tobacco exposure. She has tried nothing for the symptoms. There is no history of asthma, bronchitis or pneumonia.  Reports having difficulty sleeping, since ending a 3 year relationship. Reports taking unisom with minimal relief. Denies thoughts of harming self or others.     Review of Systems  Constitutional: Positive for fever and chills.  HENT: Positive for congestion and sinus pressure. Negative for sore throat.   Respiratory: Positive for cough and wheezing. Negative for chest tightness and shortness of breath.   Cardiovascular: Negative for chest pain and palpitations.  Psychiatric/Behavioral: Positive for sleep disturbance. Negative for suicidal ideas and self-injury.       Objective:   Physical Exam  Constitutional: She is oriented to person, place, and time. She appears well-developed and well-nourished.  HENT:  Head: Normocephalic and atraumatic.  Right Ear: External ear normal.  Left Ear: External ear normal.  Nose: Right sinus exhibits maxillary sinus tenderness and frontal sinus tenderness. Left  sinus exhibits maxillary sinus tenderness and frontal sinus tenderness.  Mouth/Throat: Posterior oropharyngeal erythema present.  Cardiovascular: Regular rhythm and normal heart sounds.   Pulmonary/Chest: Effort normal and breath sounds normal. No respiratory distress. She exhibits no tenderness.  Neurological: She is alert and oriented to person, place, and time.  Skin: Skin is warm and dry.  Psychiatric:  Slightly tearful when talking about a relationship that recently ended.           Assessment & Plan:  1. Acute bronchitis  - methylPREDNISolone acetate (DEPO-MEDROL) injection 80 mg; Inject 1 mL (80 mg total) into the muscle once. - predniSONE (STERAPRED UNI-PAK) 10 MG tablet; Take as directed. Start on 08/15/13  Dispense: 21 tablet; Refill: 0  2. Wheezing  - albuterol (PROVENTIL) (2.5 MG/3ML) 0.083% nebulizer solution 2.5 mg; Take 3 mLs (2.5 mg total) by nebulization once.  3. Insomnia  - traZODone (DESYREL) 50 MG tablet; May take 1/2 to 1 tablet every night as needed for sleep  Dispense: 30 tablet; Refill: 3 -Patient education discussed and provided RTO if symptoms worsen or no improvement may seek emergency medical treatment Patient verbalized understanding Erby Pian, FNP-C

## 2013-08-14 NOTE — Telephone Encounter (Signed)
appt today at 5

## 2013-10-21 ENCOUNTER — Encounter (INDEPENDENT_AMBULATORY_CARE_PROVIDER_SITE_OTHER): Payer: Self-pay | Admitting: Surgery

## 2013-10-21 ENCOUNTER — Ambulatory Visit (INDEPENDENT_AMBULATORY_CARE_PROVIDER_SITE_OTHER): Payer: BC Managed Care – PPO | Admitting: Surgery

## 2013-10-21 VITALS — BP 118/70 | Resp 16 | Ht 67.0 in | Wt 167.0 lb

## 2013-10-21 DIAGNOSIS — F17200 Nicotine dependence, unspecified, uncomplicated: Secondary | ICD-10-CM

## 2013-10-21 DIAGNOSIS — K430 Incisional hernia with obstruction, without gangrene: Secondary | ICD-10-CM | POA: Insufficient documentation

## 2013-10-21 DIAGNOSIS — Z860100 Personal history of colon polyps, unspecified: Secondary | ICD-10-CM

## 2013-10-21 DIAGNOSIS — K432 Incisional hernia without obstruction or gangrene: Secondary | ICD-10-CM

## 2013-10-21 DIAGNOSIS — Z720 Tobacco use: Secondary | ICD-10-CM

## 2013-10-21 HISTORY — DX: Personal history of colon polyps, unspecified: Z86.0100

## 2013-10-21 HISTORY — DX: Personal history of colonic polyps: Z86.010

## 2013-10-21 NOTE — Progress Notes (Signed)
Subjective:     Patient ID: Kendra Fuller, female   DOB: 1965-11-22, 48 y.o.   MRN: [id number]  HPI  Note: This dictation was prepared with Dragon/digital dictation along with [employer]. Any transcriptional errors that result from this process are unintentional.       Kendra Fuller  1966/01/17 [id number]  Patient Care Team: Chipper Herb, MD as PCP - General (Family Medicine) Lear Ng, MD as Consulting Physician (Gastroenterology)  This patient is a 48 y.o.female who presents today for surgical evaluation at the request of self.   Reason for visit: Worsening hernia in abdomen  Woman that required laparoscopic sigmoid colectomy for unresectable endoscopic polyp.  Came back as a tubular adenoma.  At the time, she was working hard exercise.  Intensive workup.  Smoker.  Developed a lump.  Sorry gynecologist.  There was concern for hernia.  This was confirmed last year.  Her initial surgeon, Dr. Lilyan Punt, recommended consideration of repair of the hernia.  At the time, the duct was very high chief for elective surgery.  However, he is a better financial situation.  She noticed a lump is now out all the time and stuck.  Can be sensitive.  She wished to reconsider surgery.  Her initial surgeon is now back in the [employer].  Therefore, she came to see me  She normally has a bowel movement every day.  She can walk several miles without difficulty.  No history of wound infections or MRSA.   No exertional chest/neck/shoulder/arm pain.  Patient can walk 90 minutes for about 3 miles without difficulty.    Patient Active Problem List   Diagnosis Date Noted  . Ventral incisional hernia 10/21/2013  . Personal history of colonic polyps 10/21/2013    Past Medical History  Diagnosis Date  . Rectal bleeding   . Personal history of colonic polyps 10/21/2013    Laparoscopic sigmoid colectomy July 2012  Collected Date: 03/08/2011 Received Date: 03/09/2011 Physician: Aaron Edelman D.  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 - SURGICAL RESECTION MARGINS, NEGATIVE FOR DYSPLASIA OR MALIGNANCY. - SIXTEEN LYMPH NODES, NEGATIVE FOR TUMOR (0/16). - ENDOSCOPIC TATTOO PRESENT [name] Pathologist, Electronic Signature (Case signed 03/10/2011) Specimen Kendra Fuller and Clinical Information Specimen(s) Obtained: Colon, segmental resection for tumor, splenic flexor with polyp     Past Surgical History  Procedure Laterality Date  . Oophorectomy    . Tubal ligation      left fallopian tube removed  . Laparoscopic sigmoid colectomy  03/08/2011    History   Social History  . Marital Status: Single    Spouse Name: N/A    Number of Children: N/A  . Years of Education: N/A   Occupational History  . Not on file.   Social History Main Topics  . Smoking status: Current Every Day Smoker -- 1.00 packs/day    Types: Cigarettes  . Smokeless tobacco: Never Used  . Alcohol Use: No  . Drug Use: No  . Sexual Activity: Not on file   Other Topics Concern  . Not on file   Social History Narrative  . No narrative on file    Family History  Problem Relation Age of Onset  . Cancer Mother     lung cancer  . Heart disease Father  Current Outpatient Prescriptions  Medication Sig Dispense Refill  . loratadine (CLARITIN) 10 MG tablet Take 10 mg by mouth daily.        . traZODone (DESYREL) 50 MG tablet May take 1/2 to 1 tablet every night as needed for sleep  30 tablet  3   No current facility-administered medications for this visit.     No Known Allergies  BP 118/70  Resp 16  Ht 5\' 7"  (1.702 m)  Wt 167 lb (75.751 kg)  BMI 26.15 kg/m2  No results found.   Review of Systems  Constitutional: Negative for fever, chills, diaphoresis, appetite  change and fatigue.  HENT: Negative for ear discharge, ear pain, sore throat and trouble swallowing.   Eyes: Negative for photophobia, discharge and visual disturbance.  Respiratory: Negative for cough, choking, chest tightness and shortness of breath.   Cardiovascular: Negative for chest pain and palpitations.  Gastrointestinal: Negative for nausea, vomiting, abdominal pain, diarrhea, constipation, anal bleeding and rectal pain.  Endocrine: Negative for cold intolerance and heat intolerance.  Genitourinary: Negative for dysuria, frequency and difficulty urinating.  Musculoskeletal: Negative for gait problem, myalgias and neck pain.  Skin: Negative for color change, pallor and rash.  Allergic/Immunologic: Negative for environmental allergies, food allergies and immunocompromised state.  Neurological: Negative for dizziness, speech difficulty, weakness and numbness.  Hematological: Negative for adenopathy.  Psychiatric/Behavioral: Negative for confusion and agitation. The patient is not nervous/anxious.        Objective:   Physical Exam  Constitutional: She is oriented to person, place, and time. She appears well-developed and well-nourished. No distress.  HENT:  Head: Normocephalic.  Mouth/Throat: Oropharynx is clear and moist. No oropharyngeal exudate.  Eyes: Conjunctivae and EOM are normal. Pupils are equal, round, and reactive to light. No scleral icterus.  Neck: Normal range of motion. Neck supple. No tracheal deviation present.  Cardiovascular: Normal rate, regular rhythm and intact distal pulses.   Pulmonary/Chest: Effort normal and breath sounds normal. No stridor. No respiratory distress. She exhibits no tenderness.  Abdominal: Soft. She exhibits no distension, no abdominal bruit and no mass. There is no rigidity, no rebound, no guarding, no CVA tenderness, no tenderness at McBurney's point and negative Murphy's sign. A hernia is present. Hernia confirmed positive in the ventral  area. Hernia confirmed negative in the right inguinal area and confirmed negative in the left inguinal area.    Genitourinary: No vaginal discharge found.  Musculoskeletal: Normal range of motion. She exhibits no tenderness.       Right elbow: She exhibits normal range of motion.       Left elbow: She exhibits normal range of motion.       Right wrist: She exhibits normal range of motion.       Left wrist: She exhibits normal range of motion.       Right hand: Normal strength noted.       Left hand: Normal strength noted.  Lymphadenopathy:       Head (right side): No posterior auricular adenopathy present.       Head (left side): No posterior auricular adenopathy present.    She has no cervical adenopathy.    She has no axillary adenopathy.       Right: No inguinal adenopathy present.       Left: No inguinal adenopathy present.  Neurological: She is alert and oriented to person, place, and time. No cranial nerve deficit. She exhibits normal muscle tone. Coordination normal.  Skin: Skin is warm and  dry. No rash noted. She is not diaphoretic. No erythema.  Psychiatric: She has a normal mood and affect. Her behavior is normal. Judgment and thought content normal.       Assessment:     Ventral incisional hernia.  Enlarging and now incarcerated     Plan:     I recommended surgery.  Reasonable to keep the initial recommendation of a laparoscopic approach with mesh.  She is interested in proceeding.  I discussed with her:  The anatomy & physiology of the abdominal wall was discussed.  The pathophysiology of hernias was discussed.  Natural history risks without surgery including progeressive enlargement, pain, incarceration & strangulation was discussed.   Contributors to complications such as smoking, obesity, diabetes, prior surgery, etc were discussed.   I feel the risks of no intervention will lead to serious problems that outweigh the operative risks; therefore, I recommended surgery  to reduce and repair the hernia.  I explained laparoscopic techniques with possible need for an open approach.  I noted the probable use of mesh to patch and/or buttress the hernia repair  Risks such as bleeding, infection, abscess, need for further treatment, heart attack, death, and other risks were discussed.  I noted a good likelihood this will help address the problem.   Goals of post-operative recovery were discussed as well.  Possibility that this will not correct all symptoms was explained.  I stressed the importance of low-impact activity, aggressive pain control, avoiding constipation, & not pushing through pain to minimize risk of post-operative chronic pain or injury. Possibility of reherniation especially with smoking, obesity, diabetes, immunosuppression, and other health conditions was discussed.  We will work to minimize complications.     An educational handout further explaining the pathology & treatment options was given as well.  Questions were answered.  The patient expresses understanding & wishes to proceed with surgery.  STOP SMOKING! We talked to the patient about the dangers of smoking.  We stressed that tobacco use dramatically increases the risk of peri-operative complications such as infection, tissue necrosis leaving to problems with incision/wound and organ healing, hernia, chronic pain, heart attack, stroke, DVT, pulmonary embolism, and death.  We noted there are programs in our community to help stop smoking.  Information was available.  Obtain followup colonoscopy given history of colon polyps.  She explained to do it at the 3 year anniversary this summer.  She will call [facility] gastroenterology after completion of the surgery.

## 2013-10-21 NOTE — Patient Instructions (Signed)
Please consider the recommendations that we have given you today:  Consider surgery to laparoscopically repair the hernias in your abdomen.  See the Handout(s) we have given you.  Please call our office at [phone] if you wish to schedule surgery or if you have further questions / concerns.   Hernia A hernia occurs when an internal organ pushes out through a weak spot in the abdominal wall. Hernias most commonly occur in the groin and around the navel. Hernias often can be pushed back into place (reduced). Most hernias tend to get worse over time. Some abdominal hernias can get stuck in the opening (irreducible or incarcerated hernia) and cannot be reduced. An irreducible abdominal hernia which is tightly squeezed into the opening is at risk for impaired blood supply (strangulated hernia). A strangulated hernia is a medical emergency. Because of the risk for an irreducible or strangulated hernia, surgery may be recommended to repair a hernia. CAUSES   Heavy lifting.  Prolonged coughing.  Straining to have a bowel movement.  A cut (incision) made during an abdominal surgery. HOME CARE INSTRUCTIONS   Bed rest is not required. You may continue your normal activities.  Avoid lifting more than 10 pounds (4.5 kg) or straining.  Cough gently. If you are a smoker it is best to stop. Even the best hernia repair can break down with the continual strain of coughing. Even if you do not have your hernia repaired, a cough will continue to aggravate the problem.  Do not wear anything tight over your hernia. Do not try to keep it in with an outside bandage or truss. These can damage abdominal contents if they are trapped within the hernia sac.  Eat a normal diet.  Avoid constipation. Straining over long periods of time will increase hernia size and encourage breakdown of repairs. If you cannot do this with diet alone, stool softeners may be used. SEEK IMMEDIATE MEDICAL CARE IF:   You have a  fever.  You develop increasing abdominal pain.  You feel nauseous or vomit.  Your hernia is stuck outside the abdomen, looks discolored, feels hard, or is tender.  You have any changes in your bowel habits or in the hernia that are unusual for you.  You have increased pain or swelling around the hernia.  You cannot push the hernia back in place by applying gentle pressure while lying down. MAKE SURE YOU:   Understand these instructions.  Will watch your condition.  Will get help right away if you are not doing well or get worse. Document Released: 07/25/2005 Document Revised: 10/17/2011 Document Reviewed: 03/13/2008 [facility] Patient Information 2014 [place], [place]!  We strongly recommend that you stop smoking.  Smoking increases the risk of surgery including infection in the form of an open wound, pus formation, abscess, hernia at an incision on the abdomen, etc.  You have an increased risk of other MAJOR complications such as stroke, heart attack, forming clots in the leg and/or lungs, and death.    Smoking Cessation Quitting smoking is important to your health and has many advantages. However, it is not always easy to quit since nicotine is a very addictive drug. Often times, people try 3 times or more before being able to quit. This document explains the best ways for you to prepare to quit smoking. Quitting takes hard work and a lot of effort, but you can do it. ADVANTAGES OF QUITTING SMOKING  You will live longer, feel better, and live better.  Your body will feel the impact of quitting smoking almost immediately.  Within 20 minutes, blood pressure decreases. Your pulse returns to its normal level.  After 8 hours, carbon monoxide levels in the blood return to normal. Your oxygen level increases.  After 24 hours, the chance of having a heart attack starts to decrease. Your breath, hair, and body stop smelling like smoke.  After 48 hours, damaged nerve  endings begin to recover. Your sense of taste and smell improve.  After 72 hours, the body is virtually free of nicotine. Your bronchial tubes relax and breathing becomes easier.  After 2 to 12 weeks, lungs can hold more air. Exercise becomes easier and circulation improves.  The risk of having a heart attack, stroke, cancer, or lung disease is greatly reduced.  After 1 year, the risk of coronary heart disease is cut in half.  After 5 years, the risk of stroke falls to the same as a nonsmoker.  After 10 years, the risk of lung cancer is cut in half and the risk of other cancers decreases significantly.  After 15 years, the risk of coronary heart disease drops, usually to the level of a nonsmoker.  If you are pregnant, quitting smoking will improve your chances of having a healthy baby.  The people you live with, especially any children, will be healthier.  You will have extra money to spend on things other than cigarettes. QUESTIONS TO THINK ABOUT BEFORE ATTEMPTING TO QUIT You may want to talk about your answers with your caregiver.  Why do you want to quit?  If you tried to quit in the past, what helped and what did not?  What will be the most difficult situations for you after you quit? How will you plan to handle them?  Who can help you through the tough times? Your family? Friends? A caregiver?  What pleasures do you get from smoking? What ways can you still get pleasure if you quit? Here are some questions to ask your caregiver:  How can you help me to be successful at quitting?  What medicine do you think would be best for me and how should I take it?  What should I do if I need more help?  What is smoking withdrawal like? How can I get information on withdrawal? GET READY  Set a quit date.  Change your environment by getting rid of all cigarettes, ashtrays, matches, and lighters in your home, car, or work. Do not let people smoke in your home.  Review your past  attempts to quit. Think about what worked and what did not. GET SUPPORT AND ENCOURAGEMENT You have a better chance of being successful if you have help. You can get support in many ways.  Tell your family, friends, and co-workers that you are going to quit and need their support. Ask them not to smoke around you.  Get individual, group, or telephone counseling and support. Programs are available at [employer] and health centers. Call your local health department for information about programs in your area.  Spiritual beliefs and practices may help some smokers quit.  Download a "quit meter" on your computer to keep track of quit statistics, such as how long you have gone without smoking, cigarettes not smoked, and money saved.  Get a self-help book about quitting smoking and staying off of tobacco. [place] yourself from urges to smoke. Talk to someone, go for a walk, or occupy your time with  a task.  Change your normal routine. Take a different route to work. Drink tea instead of coffee. Eat breakfast in a different place.  Reduce your stress. Take a hot bath, exercise, or read a book.  Plan something enjoyable to do every day. Reward yourself for not smoking.  Explore interactive web-based programs that specialize in helping you quit. GET MEDICINE AND USE IT CORRECTLY Medicines can help you stop smoking and decrease the urge to smoke. Combining medicine with the above behavioral methods and support can greatly increase your chances of successfully quitting smoking.  Nicotine replacement therapy helps deliver nicotine to your body without the negative effects and risks of smoking. Nicotine replacement therapy includes nicotine gum, lozenges, inhalers, nasal sprays, and skin patches. Some may be available over-the-counter and others require a prescription.  Antidepressant medicine helps people abstain from smoking, but how this works is unknown. This  medicine is available by prescription.  Nicotinic receptor partial agonist medicine simulates the effect of nicotine in your brain. This medicine is available by prescription. Ask your caregiver for advice about which medicines to use and how to use them based on your health history. Your caregiver will tell you what side effects to look out for if you choose to be on a medicine or therapy. Carefully read the information on the package. Do not use any other product containing nicotine while using a nicotine replacement product.  RELAPSE OR DIFFICULT SITUATIONS Most relapses occur within the first 3 months after quitting. Do not be discouraged if you start smoking again. Remember, most people try several times before finally quitting. You may have symptoms of withdrawal because your body is used to nicotine. You may crave cigarettes, be irritable, feel very hungry, cough often, get headaches, or have difficulty concentrating. The withdrawal symptoms are only temporary. They are strongest when you first quit, but they will go away within 10 14 days. To reduce the chances of relapse, try to:  Avoid drinking alcohol. Drinking lowers your chances of successfully quitting.  Reduce the amount of caffeine you consume. Once you quit smoking, the amount of caffeine in your body increases and can give you symptoms, such as a rapid heartbeat, sweating, and anxiety.  Avoid smokers because they can make you want to smoke.  Do not let weight gain distract you. Many smokers will gain weight when they quit, usually less than 10 pounds. Eat a healthy diet and stay active. You can always lose the weight gained after you quit.  Find ways to improve your mood other than smoking. FOR MORE INFORMATION  www.smokefree.gov    While it can be one of the most difficult things to do, the [place] community has programs to help you stop.  Consider talking with your primary care physician about options.  Also, Smoking Cessation  classes are available through the [facility]:  The smoking cessation program is a proven-effective program from the American Lung Association. The program is available for anyone 47 and older who currently smokes. The program lasts for 7 weeks and is 8 sessions. Each class will be approximately 1 1/2 hours. The program is every Tuesday.  All classes are 12-1:30pm and same location.  Event Location Information:  Location: [facility] 2nd Floor Conference Room 2-037; located next to [facility] cross streets: [place] Entrance into the [facility] is adjacent to the [employer] main entrance. The conference room is located on the  2nd floor.  Parking Instructions: Visitor parking is adjacent to [employer] main entrance and the [place]    A smoking cessation program is also offered through the [facility]. Register online at ClickDebate.gl or call [phone] for more information.   Tobacco cessation counseling is available at [facility]. Call [phone] for a free appointment.   Tobacco cessation classes also are available through the [place] in [place]. For information, call [phone].   The Patient Education Network features videos on tobacco cessation. Please consult your listings in the center of this book to find instructions on how to access this resource.   If you want more information, ask your nurse.      HERNIA REPAIR: POST OP INSTRUCTIONS  DIET: Follow a light bland diet the first 24 hours after arrival home, such as soup, liquids, crackers, etc.  Be sure to include lots of fluids daily.  Avoid fast food or heavy meals as your are more likely to get nauseated.  Eat a low fat the next few days after surgery. Take your usually prescribed home medications unless otherwise directed. PAIN CONTROL: Pain is best  controlled by a usual combination of three different methods TOGETHER: Ice/Heat Over the counter pain medication Prescription pain medication Most patients will experience some swelling and bruising around the hernia(s) such as the bellybutton, groins, or old incisions.  Ice packs or heating pads (30-60 minutes up to 6 times a day) will help. Use ice for the first few days to help decrease swelling and bruising, then switch to heat to help relax tight/sore spots and speed recovery.  Some people prefer to use ice alone, heat alone, alternating between ice & heat.  Experiment to what works for you.  Swelling and bruising can take several weeks to resolve.   It is helpful to take an over-the-counter pain medication regularly for the first few weeks.  Choose one of the following that works best for you: Naproxen (Aleve, etc)  Two 220mg  tabs twice a day Ibuprofen (Advil, etc) Three 200mg  tabs four times a day (every meal & bedtime) Acetaminophen (Tylenol, etc) 325-650mg  four times a day (every meal & bedtime) A  prescription for pain medication should be given to you upon discharge.  Take your pain medication as prescribed.  If you are having problems/concerns with the prescription medicine (does not control pain, nausea, vomiting, rash, itching, etc), please call us [phone] to see if we need to switch you to a different pain medicine that will work better for you and/or control your side effect better. If you need a refill on your pain medication, please contact your pharmacy.  They will contact our office to request authorization. Prescriptions will not be filled after 5 pm or on week-ends. Avoid getting constipated.  Between the surgery and the pain medications, it is common to experience some constipation.  Increasing fluid intake and taking a fiber supplement (such as Metamucil, Citrucel, FiberCon, MiraLax, etc) 1-2 times a day regularly will usually help prevent this problem from occurring.  A  mild laxative (prune juice, Milk of Magnesia, MiraLax, etc) should be taken according to package directions if there are no bowel movements after 48 hours.   Wash / shower every day.  You may shower over the dressings as they are waterproof.   Remove your waterproof bandages 5 days after surgery.  You may leave the incision open to air.  You may replace a dressing/Band-Aid to  cover the incision for comfort if you wish.  Continue to shower over incision(s) after the dressing is off.    ACTIVITIES as tolerated:   You may resume regular (light) daily activities beginning the next day-such as daily self-care, walking, climbing stairs-gradually increasing activities as tolerated.  If you can walk 30 minutes without difficulty, it is safe to try more intense activity such as jogging, treadmill, bicycling, low-impact aerobics, swimming, etc. Save the most intensive and strenuous activity for last such as sit-ups, heavy lifting, contact sports, etc  Refrain from any heavy lifting or straining until you are off narcotics for pain control.   DO NOT PUSH THROUGH PAIN.  Let pain be your guide: If it hurts to do something, don't do it.  Pain is your body warning you to avoid that activity for another week until the pain goes down. You may drive when you are no longer taking prescription pain medication, you can comfortably wear a seatbelt, and you can safely maneuver your car and apply brakes. You may have sexual intercourse when it is comfortable.  FOLLOW UP in our office Please call [facility] at [phone] to set up an appointment to see your surgeon in the office for a follow-up appointment approximately 2-3 weeks after your surgery. Make sure that you call for this appointment the day you arrive home to insure a convenient appointment time. 9.  IF YOU HAVE DISABILITY OR FAMILY LEAVE FORMS, BRING THEM TO THE OFFICE FOR PROCESSING.  DO NOT GIVE THEM TO YOUR DOCTOR.  WHEN TO CALL us [phone]: Poor pain  control Reactions / problems with new medications (rash/itching, nausea, etc)  Fever over 101.5 F (38.5 C) Inability to urinate Nausea and/or vomiting Worsening swelling or bruising Continued bleeding from incision. Increased pain, redness, or drainage from the incision   The clinic staff is available to answer your questions during regular business hours (8:30am-5pm).  Please don't hesitate to call and ask to speak to one of our nurses for clinical concerns.   If you have a medical emergency, go to the nearest emergency room or call 911.  A surgeon from [facility] is always on call at the hospitals in [facility], [place], [place], [place], [place]  [address] ?  [address] [address], [place], [place]   [address] MAIN: [phone] ? TOLL FREE: [phone] ? FAX: [phone] [email or web address]

## 2013-11-18 ENCOUNTER — Encounter (HOSPITAL_COMMUNITY): Payer: Self-pay

## 2013-11-22 NOTE — Pre-Procedure Instructions (Addendum)
Kendra Fuller  11/22/2013   Your procedure is scheduled on:  Tues, April 28 @ 10:30 AM  Report to [facility]  at 8:30 AM.  Call this number if you have problems the morning of surgery: [phone]   Remember:   Do not eat food or drink liquids after midnight.   Take these medicines the morning of surgery with A SIP OF WATER: Claritin(Loratadine)               Take all meds as ordered until day of surgery except as instructed below or per dr              No Goody's,BC's,Aleve,Aspirin,Ibuprofen,Fish Oil,or any Herbal Medications   Do not wear jewelry, make-up or nail polish.  Do not wear lotions, powders, or perfumes. You may wear deodorant.  Do not shave 48 hours prior to surgery.   Do not bring valuables to the hospital.  [facility] is not responsible                  for any belongings or valuables.               Contacts, dentures or bridgework may not be worn into surgery.  Leave suitcase in the car. After surgery it may be brought to your room.  For patients admitted to the hospital, discharge time is determined by your                treatment team.               Patients discharged the day of surgery will not be allowed to drive  home.    Special Instructions:  [facility] - Preparing for Surgery  Before surgery, you can play an important role.  Because skin is not sterile, your skin needs to be as free of germs as possible.  You can reduce the number of germs on you skin by washing with CHG (chlorahexidine gluconate) soap before surgery.  CHG is an antiseptic cleaner which kills germs and bonds with the skin to continue killing germs even after washing.  Please DO NOT use if you have an allergy to CHG or antibacterial soaps.  If your skin becomes reddened/irritated stop using the CHG and inform your nurse when you arrive at Short Stay.  Do not shave (including legs and underarms) for at least 48 hours prior to the first CHG shower.  You may shave your  face.  Please follow these instructions carefully:   1.  Shower with CHG Soap the night before surgery and the                                morning of Surgery.  2.  If you choose to wash your hair, wash your hair first as usual with your       normal shampoo.  3.  After you shampoo, rinse your hair and body thoroughly to remove the                      Shampoo.  4.  Use CHG as you would any other liquid soap.  You can apply chg directly       to the skin and wash gently with scrungie or a clean washcloth.  5.  Apply the CHG Soap to your body ONLY FROM THE NECK DOWN.  Do not use on open wounds or open sores.  Avoid contact with your eyes,       ears, mouth and genitals (private parts).  Wash genitals (private parts)       with your normal soap.  6.  Wash thoroughly, paying special attention to the area where your surgery        will be performed.  7.  Thoroughly rinse your body with warm water from the neck down.  8.  DO NOT shower/wash with your normal soap after using and rinsing off       the CHG Soap.  9.  Pat yourself dry with a clean towel.            10.  Wear clean pajamas.            11.  Place clean sheets on your bed the night of your first shower and do not        sleep with pets.  Day of Surgery  Do not apply any lotions/deoderants the morning of surgery.  Please wear clean clothes to the hospital/surgery center.     Please read over the following fact sheets that you were given: Pain Booklet, Coughing and Deep Breathing and Surgical Site Infection Prevention

## 2013-11-25 ENCOUNTER — Encounter (HOSPITAL_COMMUNITY): Payer: Self-pay

## 2013-11-25 ENCOUNTER — Encounter (HOSPITAL_COMMUNITY)
Admission: RE | Admit: 2013-11-25 | Discharge: 2013-11-25 | Disposition: A | Discharge status: Home / Self Care | Payer: BC Managed Care – PPO | Source: Ambulatory Visit | Attending: Surgery | Admitting: Surgery

## 2013-11-25 DIAGNOSIS — Z01812 Encounter for preprocedural laboratory examination: Secondary | ICD-10-CM | POA: Insufficient documentation

## 2013-11-25 HISTORY — DX: Other specified respiratory disorders: J98.8

## 2013-11-25 HISTORY — DX: Gastro-esophageal reflux disease without esophagitis: K21.9

## 2013-11-25 LAB — CBC
HCT: 46.4 % — ABNORMAL HIGH (ref 36.0–46.0)
Hemoglobin: 16.2 g/dL — ABNORMAL HIGH (ref 12.0–15.0)
MCH: 32.7 pg (ref 26.0–34.0)
MCHC: 34.9 g/dL (ref 30.0–36.0)
MCV: 93.7 fL (ref 78.0–100.0)
Platelets: 228 10*3/uL (ref 150–400)
RBC: 4.95 MIL/uL (ref 3.87–5.11)
RDW: 13 % (ref 11.5–15.5)
WBC: 11.2 10*3/uL — ABNORMAL HIGH (ref 4.0–10.5)

## 2013-11-25 LAB — BASIC METABOLIC PANEL
BUN: 6 mg/dL (ref 6–23)
CO2: 24 mEq/L (ref 19–32)
Calcium: 9.2 mg/dL (ref 8.4–10.5)
Chloride: 103 mEq/L (ref 96–112)
Creatinine, Ser: 0.69 mg/dL (ref 0.50–1.10)
GFR calc Af Amer: >90 mL/min (ref >90)
GFR calc non Af Amer: >90 mL/min (ref >90)
Glucose, Bld: 73 mg/dL (ref 70–99)
Potassium: 3.9 mEq/L (ref 3.7–5.3)
Sodium: 141 mEq/L (ref 137–147)

## 2013-11-25 LAB — HCG, SERUM, QUALITATIVE: Preg, Serum: NEGATIVE

## 2013-12-02 MED ORDER — CEFAZOLIN SODIUM-DEXTROSE 2-3 GM-% IV SOLR
2.0000 g | INTRAVENOUS | Status: AC
  Administered 2013-12-03: 2 g via INTRAVENOUS
  Filled 2013-12-02: qty 50

## 2013-12-03 ENCOUNTER — Encounter (HOSPITAL_COMMUNITY)
Admission: RE | Disposition: A | Discharge status: Home / Self Care | Payer: Self-pay | Source: Ambulatory Visit | Attending: Surgery

## 2013-12-03 ENCOUNTER — Ambulatory Visit (HOSPITAL_COMMUNITY): Payer: BC Managed Care – PPO | Admitting: Anesthesiology

## 2013-12-03 ENCOUNTER — Ambulatory Visit (HOSPITAL_COMMUNITY)
Admission: RE | Admit: 2013-12-03 | Discharge: 2013-12-03 | Disposition: A | Discharge status: Home / Self Care | Payer: BC Managed Care – PPO | Source: Ambulatory Visit | Attending: Surgery | Admitting: Surgery

## 2013-12-03 ENCOUNTER — Encounter (HOSPITAL_COMMUNITY): Payer: BC Managed Care – PPO | Admitting: Anesthesiology

## 2013-12-03 ENCOUNTER — Encounter (HOSPITAL_COMMUNITY): Payer: Self-pay | Admitting: Certified Registered"

## 2013-12-03 DIAGNOSIS — Z720 Tobacco use: Secondary | ICD-10-CM | POA: Diagnosis present

## 2013-12-03 DIAGNOSIS — Z860100 Personal history of colon polyps, unspecified: Secondary | ICD-10-CM | POA: Insufficient documentation

## 2013-12-03 DIAGNOSIS — K660 Peritoneal adhesions (postprocedural) (postinfection): Secondary | ICD-10-CM | POA: Insufficient documentation

## 2013-12-03 DIAGNOSIS — F17200 Nicotine dependence, unspecified, uncomplicated: Secondary | ICD-10-CM | POA: Diagnosis not present

## 2013-12-03 DIAGNOSIS — K430 Incisional hernia with obstruction, without gangrene: Secondary | ICD-10-CM | POA: Insufficient documentation

## 2013-12-03 DIAGNOSIS — K219 Gastro-esophageal reflux disease without esophagitis: Secondary | ICD-10-CM | POA: Diagnosis not present

## 2013-12-03 DIAGNOSIS — Z9049 Acquired absence of other specified parts of digestive tract: Secondary | ICD-10-CM | POA: Insufficient documentation

## 2013-12-03 DIAGNOSIS — K436 Other and unspecified ventral hernia with obstruction, without gangrene: Secondary | ICD-10-CM

## 2013-12-03 HISTORY — PX: VENTRAL HERNIA REPAIR: SHX424

## 2013-12-03 SURGERY — REPAIR, HERNIA, VENTRAL, LAPAROSCOPIC
Anesthesia: General | Site: Abdomen

## 2013-12-03 MED ORDER — PROMETHAZINE HCL 25 MG/ML IJ SOLN: 6.2500 mg | INTRAMUSCULAR | Status: DC | PRN

## 2013-12-03 MED ORDER — OXYCODONE HCL 5 MG PO TABS: 5.0000 mg | ORAL_TABLET | Freq: Once | ORAL | Status: DC | PRN

## 2013-12-03 MED ORDER — PHENYLEPHRINE HCL 10 MG/ML IJ SOLN
INTRAMUSCULAR | Status: DC | PRN
  Administered 2013-12-03: 80 ug via INTRAVENOUS

## 2013-12-03 MED ORDER — 0.9 % SODIUM CHLORIDE (POUR BTL) OPTIME
TOPICAL | Status: DC | PRN
  Administered 2013-12-03: 1000 mL

## 2013-12-03 MED ORDER — BUPIVACAINE-EPINEPHRINE (PF) 0.25% -1:200000 IJ SOLN
INTRAMUSCULAR | Status: AC
  Filled 2013-12-03: qty 30

## 2013-12-03 MED ORDER — PHENYLEPHRINE 40 MCG/ML (10ML) SYRINGE FOR IV PUSH (FOR BLOOD PRESSURE SUPPORT)
PREFILLED_SYRINGE | INTRAVENOUS | Status: AC
  Filled 2013-12-03: qty 10

## 2013-12-03 MED ORDER — BUPIVACAINE-EPINEPHRINE 0.25% -1:200000 IJ SOLN
INTRAMUSCULAR | Status: DC | PRN
  Administered 2013-12-03: 15 mL

## 2013-12-03 MED ORDER — BUPIVACAINE-EPINEPHRINE 0.25% -1:200000 IJ SOLN
INTRAMUSCULAR | Status: DC | PRN
  Administered 2013-12-03: 30 mL

## 2013-12-03 MED ORDER — NAPROXEN 500 MG PO TABS: 500.0000 mg | ORAL_TABLET | Freq: Two times a day (BID) | ORAL | Status: DC

## 2013-12-03 MED ORDER — LACTATED RINGERS IV SOLN
INTRAVENOUS | Status: DC
  Administered 2013-12-03 (×2): via INTRAVENOUS

## 2013-12-03 MED ORDER — ONDANSETRON HCL 4 MG/2ML IJ SOLN
INTRAMUSCULAR | Status: DC | PRN
  Administered 2013-12-03: 4 mg via INTRAVENOUS

## 2013-12-03 MED ORDER — SUFENTANIL CITRATE 50 MCG/ML IV SOLN
INTRAVENOUS | Status: DC | PRN
  Administered 2013-12-03: 5 ug via INTRAVENOUS
  Administered 2013-12-03: 10 ug via INTRAVENOUS
  Administered 2013-12-03: 20 ug via INTRAVENOUS

## 2013-12-03 MED ORDER — ROCURONIUM BROMIDE 50 MG/5ML IV SOLN
INTRAVENOUS | Status: AC
  Filled 2013-12-03: qty 1

## 2013-12-03 MED ORDER — MIDAZOLAM HCL 5 MG/5ML IJ SOLN
INTRAMUSCULAR | Status: DC | PRN
  Administered 2013-12-03: 2 mg via INTRAVENOUS

## 2013-12-03 MED ORDER — HYDROMORPHONE HCL PF 1 MG/ML IJ SOLN
INTRAMUSCULAR | Status: AC
  Filled 2013-12-03: qty 1

## 2013-12-03 MED ORDER — OXYCODONE HCL 5 MG/5ML PO SOLN: 5.0000 mg | Freq: Once | ORAL | Status: DC | PRN

## 2013-12-03 MED ORDER — MIDAZOLAM HCL 2 MG/2ML IJ SOLN
INTRAMUSCULAR | Status: AC
  Filled 2013-12-03: qty 2

## 2013-12-03 MED ORDER — KETOROLAC TROMETHAMINE 30 MG/ML IJ SOLN
INTRAMUSCULAR | Status: AC
  Filled 2013-12-03: qty 1

## 2013-12-03 MED ORDER — CHLORHEXIDINE GLUCONATE 4 % EX LIQD
1.0000 "application " | Freq: Once | CUTANEOUS | Status: DC
  Filled 2013-12-03: qty 15

## 2013-12-03 MED ORDER — LIDOCAINE HCL (CARDIAC) 20 MG/ML IV SOLN
INTRAVENOUS | Status: AC
  Filled 2013-12-03: qty 5

## 2013-12-03 MED ORDER — OXYCODONE HCL 5 MG PO TABS: 5.0000 mg | ORAL_TABLET | ORAL | Status: DC | PRN

## 2013-12-03 MED ORDER — ALBUTEROL SULFATE (2.5 MG/3ML) 0.083% IN NEBU
2.5000 mg | INHALATION_SOLUTION | RESPIRATORY_TRACT | Status: AC
  Administered 2013-12-03: 2.5 mg via RESPIRATORY_TRACT
  Filled 2013-12-03: qty 3

## 2013-12-03 MED ORDER — KETOROLAC TROMETHAMINE 30 MG/ML IJ SOLN
INTRAMUSCULAR | Status: DC | PRN
  Administered 2013-12-03: 30 mg via INTRAVENOUS

## 2013-12-03 MED ORDER — LIDOCAINE HCL (CARDIAC) 20 MG/ML IV SOLN
INTRAVENOUS | Status: DC | PRN
Start: 2013-12-03 — End: 2013-12-03
  Administered 2013-12-03: 80 mg via INTRAVENOUS

## 2013-12-03 MED ORDER — ROCURONIUM BROMIDE 100 MG/10ML IV SOLN
INTRAVENOUS | Status: DC | PRN
  Administered 2013-12-03: 50 mg via INTRAVENOUS

## 2013-12-03 MED ORDER — HYDROMORPHONE HCL PF 1 MG/ML IJ SOLN
0.2500 mg | INTRAMUSCULAR | Status: DC | PRN
  Administered 2013-12-03: 0.5 mg via INTRAVENOUS

## 2013-12-03 MED ORDER — ONDANSETRON HCL 4 MG/2ML IJ SOLN
INTRAMUSCULAR | Status: AC
  Filled 2013-12-03: qty 2

## 2013-12-03 MED ORDER — SODIUM CHLORIDE 0.9 % IR SOLN
Status: DC | PRN
  Administered 2013-12-03: 1000 mL

## 2013-12-03 MED ORDER — BUPIVACAINE 0.25 % ON-Q PUMP DUAL CATH 300 ML
300.0000 mL | INJECTION | Status: DC
Start: 2013-12-03 — End: 2013-12-03
  Filled 2013-12-03: qty 300

## 2013-12-03 MED ORDER — SUFENTANIL CITRATE 50 MCG/ML IV SOLN
INTRAVENOUS | Status: AC
  Filled 2013-12-03: qty 1

## 2013-12-03 MED ORDER — PROPOFOL 10 MG/ML IV BOLUS
INTRAVENOUS | Status: AC
  Filled 2013-12-03: qty 20

## 2013-12-03 MED ORDER — PROPOFOL 10 MG/ML IV BOLUS
INTRAVENOUS | Status: DC | PRN
  Administered 2013-12-03: 180 mg via INTRAVENOUS

## 2013-12-03 SURGICAL SUPPLY — 58 items
APPLICATOR COTTON TIP 6IN STRL (MISCELLANEOUS) ×1 IMPLANT
APPLIER CLIP LOGIC TI 5 (MISCELLANEOUS) IMPLANT
APR CLP MED LRG 33X5 (MISCELLANEOUS)
BINDER ABD UNIV 10 28-50 (GAUZE/BANDAGES/DRESSINGS) IMPLANT
BINDER ABDOM UNIV 10 (GAUZE/BANDAGES/DRESSINGS) ×2
BINDER ABDOMINAL 12 ML 46-62 (SOFTGOODS) ×1 IMPLANT
BLADE 10 SAFETY STRL DISP (BLADE) ×1 IMPLANT
BLADE SURG ROTATE 9660 (MISCELLANEOUS) IMPLANT
CANISTER SUCTION 2500CC (MISCELLANEOUS) IMPLANT
CATH KIT ON Q 7.5IN SLV (PAIN MANAGEMENT) ×2 IMPLANT
CHLORAPREP W/TINT 26ML (MISCELLANEOUS) ×2 IMPLANT
COVER SURGICAL LIGHT HANDLE (MISCELLANEOUS) ×2 IMPLANT
DEVICE SECURE STRAP 25 ABSORB (INSTRUMENTS) ×1 IMPLANT
DEVICE TROCAR PUNCTURE CLOSURE (ENDOMECHANICALS) ×1 IMPLANT
DRAIN CHANNEL 15F RND FF W/TCR (WOUND CARE) IMPLANT
DRAPE WARM FLUID 44X44 (DRAPE) ×2 IMPLANT
ELECT REM PT RETURN 9FT ADLT (ELECTROSURGICAL) ×2
ELECTRODE REM PT RTRN 9FT ADLT (ELECTROSURGICAL) ×1 IMPLANT
EVACUATOR SILICONE 100CC (DRAIN) IMPLANT
GLOVE BIO SURGEON STRL SZ8 (GLOVE) ×1 IMPLANT
GLOVE BIOGEL PI IND STRL 7.0 (GLOVE) IMPLANT
GLOVE BIOGEL PI IND STRL 7.5 (GLOVE) IMPLANT
GLOVE BIOGEL PI IND STRL 8 (GLOVE) ×1 IMPLANT
GLOVE BIOGEL PI INDICATOR 7.0 (GLOVE) ×1
GLOVE BIOGEL PI INDICATOR 7.5 (GLOVE) ×3
GLOVE BIOGEL PI INDICATOR 8 (GLOVE) ×2
GLOVE ECLIPSE 7.5 STRL STRAW (GLOVE) ×1 IMPLANT
GLOVE ECLIPSE 8.0 STRL XLNG CF (GLOVE) ×2 IMPLANT
GOWN STRL REUS W/ TWL LRG LVL3 (GOWN DISPOSABLE) ×2 IMPLANT
GOWN STRL REUS W/ TWL XL LVL3 (GOWN DISPOSABLE) ×1 IMPLANT
GOWN STRL REUS W/TWL LRG LVL3 (GOWN DISPOSABLE) ×8
GOWN STRL REUS W/TWL XL LVL3 (GOWN DISPOSABLE) ×2
KIT BASIN OR (CUSTOM PROCEDURE TRAY) ×2 IMPLANT
KIT ROOM TURNOVER OR (KITS) ×2 IMPLANT
MESH VENTRALIGHT ST 8X10 (Mesh General) ×1 IMPLANT
NDL SPNL 22GX3.5 QUINCKE BK (NEEDLE) ×1 IMPLANT
NEEDLE 22X1 1/2 (OR ONLY) (NEEDLE) ×2 IMPLANT
NEEDLE SPNL 22GX3.5 QUINCKE BK (NEEDLE) ×2 IMPLANT
NS IRRIG 1000ML POUR BTL (IV SOLUTION) ×2 IMPLANT
PAD ARMBOARD 7.5X6 YLW CONV (MISCELLANEOUS) ×4 IMPLANT
SCALPEL HARMONIC ACE (MISCELLANEOUS) IMPLANT
SCISSORS LAP 5X35 DISP (ENDOMECHANICALS) IMPLANT
SET IRRIG TUBING LAPAROSCOPIC (IRRIGATION / IRRIGATOR) ×1 IMPLANT
SLEEVE ENDOPATH XCEL 5M (ENDOMECHANICALS) ×2 IMPLANT
STRIP CLOSURE SKIN 1/2X4 (GAUZE/BANDAGES/DRESSINGS) ×1 IMPLANT
SUT MNCRL AB 4-0 PS2 18 (SUTURE) ×2 IMPLANT
SUT PROLENE 1 CT (SUTURE) ×7 IMPLANT
SUT VICRYL 0 UR6 27IN ABS (SUTURE) IMPLANT
TACKER 5MM HERNIA 3.5CML NAB (ENDOMECHANICALS) IMPLANT
TOWEL OR 17X24 6PK STRL BLUE (TOWEL DISPOSABLE) ×2 IMPLANT
TOWEL OR 17X26 10 PK STRL BLUE (TOWEL DISPOSABLE) ×2 IMPLANT
TOWEL OR NON WOVEN STRL DISP B (DISPOSABLE) ×1 IMPLANT
TRAY FOLEY CATH 14FRSI W/METER (CATHETERS) IMPLANT
TRAY LAPAROSCOPIC (CUSTOM PROCEDURE TRAY) ×2 IMPLANT
TROCAR XCEL NON-BLD 11X100MML (ENDOMECHANICALS) ×1 IMPLANT
TROCAR XCEL NON-BLD 5MMX100MML (ENDOMECHANICALS) ×3 IMPLANT
TUNNELER SHEATH ON-Q 16GX12 DP (PAIN MANAGEMENT) ×1 IMPLANT
WATER STERILE IRR 1000ML POUR (IV SOLUTION) IMPLANT

## 2013-12-03 NOTE — Anesthesia Procedure Notes (Signed)
Procedure Name: Intubation Date/Time: 12/03/2013 10:40 AM Performed by: Melina Copa, Gerianne Simonet R Pre-anesthesia Checklist: Patient identified, Emergency Drugs available, Suction available, Patient being monitored and Timeout performed Patient Re-evaluated:Patient Re-evaluated prior to inductionOxygen Delivery Method: Circle system utilized Preoxygenation: Pre-oxygenation with 100% oxygen Intubation Type: IV induction Ventilation: Mask ventilation without difficulty Laryngoscope Size: Mac and 3 Grade View: Grade II Tube type: Oral Tube size: 7.5 mm Number of attempts: 1 Airway Equipment and Method: Stylet Placement Confirmation: ETT inserted through vocal cords under direct vision,  positive ETCO2 and breath sounds checked- equal and bilateral Secured at: 21 cm Tube secured with: Tape Dental Injury: Teeth and Oropharynx as per pre-operative assessment

## 2013-12-03 NOTE — Transfer of Care (Signed)
Immediate Anesthesia Transfer of Care Note  Patient: Kendra Fuller  Procedure(s) Performed: Procedure(s): LAPAROSCOPIC LYSIS OF ADHESIONS AND REPAIR OF INCARCERATED VENTRAL HERNIA WITH MESH (N/A)  Patient Location: PACU  Anesthesia Type:General  Level of Consciousness: awake  Airway & Oxygen Therapy: Patient Spontanous Breathing and Patient connected to nasal cannula oxygen  Post-op Assessment: Report given to PACU RN, Post -op Vital signs reviewed and stable and Patient moving all extremities  Post vital signs: Reviewed and stable  Complications: No apparent anesthesia complications

## 2013-12-03 NOTE — Op Note (Signed)
12/03/2013  1:17 PM  PATIENT:  Kendra Fuller  48 y.o. female  Patient Care Team: Chipper Herb, MD as PCP - General (Family Medicine) Lear Ng, MD as Consulting Physician (Gastroenterology)  PRE-OPERATIVE DIAGNOSIS:  Incarcerated incisional ventral hernia  POST-OPERATIVE DIAGNOSIS:   Incarcerated incisional ventral hernias x4   PROCEDURE:  Procedure(s): LAPAROSCOPIC LYSIS OF ADHESIONS x 90 min REPAIR OF INCARCERATED VENTRAL HERNIA WITH MESH  SURGEON:  Surgeon(s): Adin Hector, MD  ASSISTANT: Darcella Gasman, PA-student, [employer]   ANESTHESIA:   local and general  EBL:  Total I/O In: 1000 [I.V.:1000] Out: -   Delay start of Pharmacological VTE agent (>24hrs) due to surgical blood loss or risk of bleeding:  no  DRAINS: none   SPECIMEN:  No Specimen  DISPOSITION OF SPECIMEN:  N/A  COUNTS:  YES  PLAN OF CARE: Discharge to home after PACU  PATIENT DISPOSITION:  PACU - hemodynamically stable.  INDICATION: Pleasant patient has developed a ventral wall abdominal hernia.   Recommendation was made for surgical repair:  The anatomy & physiology of the abdominal wall was discussed. The pathophysiology of hernias was discussed. Natural history risks without surgery including progeressive enlargement, pain, incarceration & strangulation was discussed. Contributors to complications such as smoking, obesity, diabetes, prior surgery, etc were discussed.  I feel the risks of no intervention will lead to serious problems that outweigh the operative risks; therefore, I recommended surgery to reduce and repair the hernia. I explained laparoscopic techniques with possible need for an open approach. I noted the probable use of mesh to patch and/or buttress the hernia repair  Risks such as bleeding, infection, abscess, need for further treatment, heart attack, death, and other risks were discussed. I noted a good likelihood this will help address the problem. Goals of  post-operative recovery were discussed as well. Possibility that this will not correct all symptoms was explained. I stressed the importance of low-impact activity, aggressive pain control, avoiding constipation, & not pushing through pain to minimize risk of post-operative chronic pain or injury. Possibility of reherniation especially with smoking, obesity, diabetes, immunosuppression, and other health conditions was discussed. We will work to minimize complications.  An educational handout further explaining the pathology & treatment options was given as well. Questions were answered. The patient expresses understanding & wishes to proceed with surgery.   OR FINDINGS: [name] x 4 (mostly supraumbilical), Swiss cheese type , 12x4cm region   Type of repair - Laparoscopic underlay repair   Name of mesh - Bard Ventralight dual sided (polypropylene / Seprafilm)  Size of mesh - Length 25 cm, Width 20 cm  Mesh overlap - 5-7cm cm  Placement of mesh - Intraperitoneal underlay repair   DESCRIPTION:   Informed consent was confirmed. The patient underwent general anaesthesia without difficulty. The patient was positioned appropriately. VTE prevention in place. The patient's abdomen was clipped, prepped, & draped in a sterile fashion. Surgical timeout confirmed our plan.  The patient was positioned in reverse Trendelenburg. Abdominal entry was gained using optical entry technique in the left upper abdomen. Entry was clean. I induced carbon dioxide insufflation. Camera inspection revealed no injury. Extra ports were carefully placed under direct laparoscopic visualization.   The patient had ## adhesions of omentum to the central abdomen.  I did laparoscopic lysis of adhesions to expose the entire anterior abdominal wall.  I primarily used and focused cold scissors.    I reduced many 2-4cm swiss cheese hernias in a 12x4cm region  periumbilical.  I made sure hemostasis was good.  I mapped  out the region using a needle passer.   To ensure that I would have at least 5 cm radial coverage outside of the hernia defect, I chose a 25x20 cm dual sided mesh.  I placed #1 Prolene stitches around its edge about every 5 cm = 14 total.  I rolled the mesh & placed into the peritoneal cavity through the 10 cm fascial defect.  I unrolled  the mesh and positioned it appropriately.  I secured the mesh to cover up the hernia defect using a laparoscopic suture passer to pass the tails of the Prolene through the abdominal wall & tagged them with clamps.  I started out in four corners to make sure I had the mesh centered over the hernia defect appropriately, and then proceeded to work in quadrants.  We evacuated CO2 & desufflated the abdomen.  I tied the fascial stitches down.  I reinsufflated the abdomen.  The mesh provided at least 5-10 cm circumferential coverage around the entire region of hernia defects.   I tacked the edges & central part of the mesh to the peritoneum/posterior rectus fascia with  SecureStrap absorbable tacks.   Hemostasis was excellent.  I closed the fascia port site on the 10 mm port using a 0 Vicryl stitch using laparoscopic intracorporeal suture passer.  I then placed On-Q catheter sheaths in the preperitoneal plane under direct laparoscopic guidance from the subxiphoid region down to the posterior iliac crests in the lower flanks. I did reinspection. Hemostasis was good. Mesh laid well. Capnoperitoneum was evacuated. Ports were removed. The skin was closed with Monocryl at the port sites and Steri-Strips on the fascial stitch puncture sites. OnQ catheters were placed and the sheathes peeled away. On-Q pump was secured. Patient is being extubated to go to the recovery room. I discussed operative findings & post-operative instructions with the patient's family.

## 2013-12-03 NOTE — Anesthesia Preprocedure Evaluation (Addendum)
Anesthesia Evaluation  Patient identified by MRN, date of birth, ID band Patient awake    Reviewed: Allergy & Precautions, H&P , NPO status , Patient's Chart, lab work & pertinent test results  Airway Mallampati: II TM Distance: >3 FB Neck ROM: full    Dental  (+) Teeth Intact, Dental Advidsory Given   Pulmonary Current Smoker,    + wheezing      Cardiovascular negative cardio ROS      Neuro/Psych negative neurological ROS  negative psych ROS   GI/Hepatic Neg liver ROS, GERD-  ,  Endo/Other  negative endocrine ROS  Renal/GU negative Renal ROS     Musculoskeletal   Abdominal Normal abdominal exam  (+)   Peds  Hematology   Anesthesia Other Findings Wheezing treated with albuterol while in pre-op.  Reproductive/Obstetrics negative OB ROS                         Anesthesia Physical Anesthesia Plan  ASA: II  Anesthesia Plan: General ETT   Post-op Pain Management:    Induction:   Airway Management Planned:   Additional Equipment:   Intra-op Plan:   Post-operative Plan:   Informed Consent: I have reviewed the patients History and Physical, chart, labs and discussed the procedure including the risks, benefits and alternatives for the proposed anesthesia with the patient or authorized representative who has indicated his/her understanding and acceptance.   Dental Advisory Given  Plan Discussed with: Anesthesiologist, CRNA and Surgeon  Anesthesia Plan Comments:        Anesthesia Quick Evaluation

## 2013-12-03 NOTE — Anesthesia Postprocedure Evaluation (Signed)
Anesthesia Post Note  Patient: Kendra Fuller  Procedure(s) Performed: Procedure(s) (LRB): LAPAROSCOPIC LYSIS OF ADHESIONS AND REPAIR OF INCARCERATED VENTRAL HERNIA WITH MESH (N/A)  Anesthesia type: general  Patient location: PACU  Post pain: Pain level controlled  Post assessment: Patient's Cardiovascular Status Stable  Last Vitals:  Filed Vitals:   12/03/13 1415  BP: 122/79  Pulse: 93  Temp:   Resp: 14    Post vital signs: Reviewed and stable  Level of consciousness: sedated  Complications: No apparent anesthesia complications

## 2013-12-03 NOTE — H&P (Signed)
[name], MD, [facility] [address] [place], [place] [address] Phone: [phone] FAX: [place]  1965-09-10 [id number]  CARE TEAM:  PCP: Kendra Gainer, MD  Outpatient Care Team: Patient Care Team: Kendra Herb, MD as PCP - General (Family Medicine) Kendra Ng, MD as Consulting Physician (Gastroenterology)  Inpatient Treatment Team: Treatment Team: Attending Provider: Adin Hector, MD  This patient is a 48 y.o.female who presents today for surgical evaluation.   Reason for visit: Worsening hernia in abdomen   Woman that required laparoscopic sigmoid colectomy for unresectable endoscopic polyp. Came back as a tubular adenoma.  She had developed a hernia. Her initial surgeon, Dr. Lilyan Fuller, recommended consideration of repair of the hernia. At the time, the deductable on her insurance was very chief for elective surgery.  Now she is a better financial situation.  The lump is now out all the time and stuck. Can be sensitive. She wished to reconsider surgery. Her initial surgeon is now back in the [facility]. Therefore, she came to see me   She normally has a bowel movement every day. She can walk several miles without difficulty. No history of wound infections or MRSA. No exertional chest/neck/shoulder/arm pain. Patient can walk 90 minutes for about 3 miles without difficulty.  No new events   Past Medical History  Diagnosis Date  . Rectal bleeding   . Personal history of colonic polyps 10/21/2013    Laparoscopic sigmoid colectomy July 2012  Collected Date: 03/08/2011 Received Date: 03/09/2011 Physician: Kendra Edelman D. 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 - SURGICAL RESECTION MARGINS, NEGATIVE FOR DYSPLASIA OR MALIGNANCY. - SIXTEEN LYMPH NODES, NEGATIVE FOR TUMOR (0/16). - ENDOSCOPIC TATTOO PRESENT Kendra Fuller Pathologist, Electronic Signature (Case signed 03/10/2011) Specimen Kendra Fuller and Clinical Information Specimen(s) Obtained: Colon, segmental resection for tumor, splenic flexor with polyp   . Congestion of upper airway     sinus and chest 1 week, fever one nite  . GERD (gastroesophageal reflux disease)     occ    Past Surgical History  Procedure Laterality Date  . Oophorectomy Left 97    also lft tube  . Laparoscopic sigmoid colectomy  03/08/2011  . Colon surgery    . Tubal ligation      left fallopian tube removed    History   Social History  . Marital Status: Single    Spouse Name: N/A    Number of Children: N/A  . Years of Education: N/A   Occupational History  . Not on file.   Social History Main Topics  . Smoking status: Current Every Day Smoker -- 1.00 packs/day for 12 years    Types: Cigarettes  . Smokeless tobacco: Never Used  . Alcohol Use: Yes     Comment: occ  . Drug Use: No  . Sexual Activity: Not on file   Other Topics Concern  . Not on file   Social History Narrative  . No narrative on file    Family History  Problem Relation Age of Onset  . Cancer Mother     lung cancer  . Heart disease Father     Current Facility-Administered Medications  Medication Dose Route  Frequency Provider Last Rate Last Dose  . ceFAZolin (ANCEF) IVPB 2 g/50 mL premix  2 g Intravenous On Call to OR Kendra Hector, MD      . chlorhexidine (HIBICLENS) 4 % liquid 1 application  1 application Topical Once Kendra Hector, MD      . Derrill Memo ON 12/04/2013] chlorhexidine (HIBICLENS) 4 % liquid 1 application  1 application Topical Once Kendra Hector, MD      . lactated ringers infusion   Intravenous Continuous Kendra Boston, MD 10 mL/hr at 12/03/13 0930       No Known Allergies   BP 129/75  Pulse 90   Temp(Src) 97.5 F (36.4 C) (Oral)  Resp 16  Ht 5\' 7"  (1.702 m)  Wt 169 lb 8.5 oz (76.899 kg)  BMI 26.55 kg/m2  SpO2 95%  Review of Systems  Constitutional: Negative for fever, chills, diaphoresis, appetite change and fatigue.  HENT: Negative for ear discharge, ear pain, sore throat and trouble swallowing.  Eyes: Negative for photophobia, discharge and visual disturbance.  Respiratory: Negative for cough, choking, chest tightness and shortness of breath.  Cardiovascular: Negative for chest pain and palpitations.  Gastrointestinal: Negative for nausea, vomiting, abdominal pain, diarrhea, constipation, anal bleeding and rectal pain.  Endocrine: Negative for cold intolerance and heat intolerance.  Genitourinary: Negative for dysuria, frequency and difficulty urinating.  Musculoskeletal: Negative for gait problem, myalgias and neck pain.  Skin: Negative for color change, pallor and rash.  Allergic/Immunologic: Negative for environmental allergies, food allergies and immunocompromised state.  Neurological: Negative for dizziness, speech difficulty, weakness and numbness.  Hematological: Negative for adenopathy.  Psychiatric/Behavioral: Negative for confusion and agitation. The patient is not nervous/anxious.   Objective:   Physical Exam  Constitutional: She is oriented to person, place, and time. She appears well-developed and well-nourished. No distress.  HENT:  Head: Normocephalic.  Mouth/Throat: Oropharynx is clear and moist. No oropharyngeal exudate.  Eyes: Conjunctivae and EOM are normal. Pupils are equal, round, and reactive to light. No scleral icterus.  Neck: Normal range of motion. Neck supple. No tracheal deviation present.  Cardiovascular: Normal rate, regular rhythm and intact distal pulses.  Pulmonary/Chest: Effort normal and breath sounds normal. No stridor. No respiratory distress. She exhibits no tenderness.  Abdominal: Soft. She exhibits no distension, no abdominal bruit  and no mass. There is no rigidity, no rebound, no guarding, no CVA tenderness, no tenderness at McBurney's point and negative Murphy's sign. A hernia is present. Hernia confirmed positive in the ventral area. Hernia confirmed negative in the right inguinal area and confirmed negative in the left inguinal area.    Genitourinary: No vaginal discharge found.  Musculoskeletal: Normal range of motion. She exhibits no tenderness.  Right elbow: She exhibits normal range of motion.  Left elbow: She exhibits normal range of motion.  Right wrist: She exhibits normal range of motion.  Left wrist: She exhibits normal range of motion.  Right hand: Normal strength noted.  Left hand: Normal strength noted.  Lymphadenopathy:  Head (right side): No posterior auricular adenopathy present.  Head (left side): No posterior auricular adenopathy present.  She has no cervical adenopathy.  She has no axillary adenopathy.  Right: No inguinal adenopathy present.  Left: No inguinal adenopathy present.  Neurological: She is alert and oriented to person, place, and time. No cranial nerve deficit. She exhibits normal muscle tone. Coordination normal.  Skin: Skin is warm and dry. No rash noted. She is not diaphoretic. No erythema.  Psychiatric: She has  a normal mood and affect. Her behavior is normal. Judgment and thought content normal.     Results:   Labs: No results found for this or any previous visit (from the past 48 hour(s)).  Imaging / Studies: No results found.  Medications / Allergies: per chart  Antibiotics: Anti-infectives   Start     Dose/Rate Route Frequency Ordered Stop   12/03/13 0600  ceFAZolin (ANCEF) IVPB 2 g/50 mL premix     2 g 100 mL/hr over 30 Minutes Intravenous On call to O.R. 12/02/13 1409 12/04/13 0559      Assessment  Kendra Fuller  48 y.o. female  Day of Surgery  Procedure(s): LAPAROSCOPIC VENTRAL WALL HERNIA  Problem List:  Principal Problem:   Incisional hernia,  incarcerated Active Problems:   Personal history of colonic polyps   Tobacco abuse   Ventral incisional hernia. Enlarging and now incarcerated   Plan:   Lap [place] repair:   The anatomy & physiology of the abdominal wall was discussed. The pathophysiology of hernias was discussed. Natural history risks without surgery including progeressive enlargement, pain, incarceration & strangulation was discussed. Contributors to complications such as smoking, obesity, diabetes, prior surgery, etc were discussed.  I feel the risks of no intervention will lead to serious problems that outweigh the operative risks; therefore, I recommended surgery to reduce and repair the hernia. I explained laparoscopic techniques with possible need for an open approach. I noted the probable use of mesh to patch and/or buttress the hernia repair  Risks such as bleeding, infection, abscess, need for further treatment, heart attack, death, and other risks were discussed. I noted a good likelihood this will help address the problem. Goals of post-operative recovery were discussed as well. Possibility that this will not correct all symptoms was explained. I stressed the importance of low-impact activity, aggressive pain control, avoiding constipation, & not pushing through pain to minimize risk of post-operative chronic pain or injury. Possibility of reherniation especially with smoking, obesity, diabetes, immunosuppression, and other health conditions was discussed. We will work to minimize complications.  An educational handout further explaining the pathology & treatment options was given as well. Questions were answered. The patient expresses understanding & wishes to proceed with surgery.   -VTE prophylaxis- SCDs, etc -mobilize as tolerated to help recovery    Kendra Fuller, M.D., F.A.C.S. Gastrointestinal and Minimally Invasive Surgery [facility] [address] [place], [place]  [phone] Main / Paging   12/03/2013  Note: This dictation was prepared with Dragon/digital dictation along with [facility] technology. Any transcriptional errors that result from this process are unintentional.

## 2013-12-03 NOTE — Discharge Instructions (Signed)
HERNIA REPAIR: POST OP INSTRUCTIONS ° °1. DIET: Follow a light bland diet the first 24 hours after arrival home, such as soup, liquids, crackers, etc.  Be sure to include lots of fluids daily.  Avoid fast food or heavy meals as your are more likely to get nauseated.  Eat a low fat the next few days after surgery. °2. Take your usually prescribed home medications unless otherwise directed. °3. PAIN CONTROL: °a. Pain is best controlled by a usual combination of three different methods TOGETHER: °i. Ice/Heat °ii. Over the counter pain medication °iii. Prescription pain medication °b. Most patients will experience some swelling and bruising around the hernia(s) such as the bellybutton, groins, or old incisions.  Ice packs or heating pads (30-60 minutes up to 6 times a day) will help. Use ice for the first few days to help decrease swelling and bruising, then switch to heat to help relax tight/sore spots and speed recovery.  Some people prefer to use ice alone, heat alone, alternating between ice & heat.  Experiment to what works for you.  Swelling and bruising can take several weeks to resolve.   °c. It is helpful to take an over-the-counter pain medication regularly for the first few weeks.  Choose one of the following that works best for you: °i. Naproxen (Aleve, etc)  Two 220mg tabs twice a day °ii. Ibuprofen (Advil, etc) Three 200mg tabs four times a day (every meal & bedtime) °iii. Acetaminophen (Tylenol, etc) 325-650mg four times a day (every meal & bedtime) °d. A  prescription for pain medication should be given to you upon discharge.  Take your pain medication as prescribed.  °i. If you are having problems/concerns with the prescription medicine (does not control pain, nausea, vomiting, rash, itching, etc), please call us [phone] to see if we need to switch you to a different pain medicine that will work better for you and/or control your side effect better. °ii. If you need a refill on your pain  medication, please contact your pharmacy.  They will contact our office to request authorization. Prescriptions will not be filled after 5 pm or on week-ends. °4. Avoid getting constipated.  Between the surgery and the pain medications, it is common to experience some constipation.  Increasing fluid intake and taking a fiber supplement (such as Metamucil, Citrucel, FiberCon, MiraLax, etc) 1-2 times a day regularly will usually help prevent this problem from occurring.  A mild laxative (prune juice, Milk of Magnesia, MiraLax, etc) should be taken according to package directions if there are no bowel movements after 48 hours.   °5. Wash / shower every day.  You may shower over the dressings as they are waterproof.   °6. Remove your waterproof bandages 5 days after surgery.  You may leave the incision open to air.  You may replace a dressing/Band-Aid to cover the incision for comfort if you wish.  Continue to shower over incision(s) after the dressing is off. ° ° ° °7. ACTIVITIES as tolerated:   °a. You may resume regular (light) daily activities beginning the next day--such as daily self-care, walking, climbing stairs--gradually increasing activities as tolerated.  If you can walk 30 minutes without difficulty, it is safe to try more intense activity such as jogging, treadmill, bicycling, low-impact aerobics, swimming, etc. °b. Save the most intensive and strenuous activity for last such as sit-ups, heavy lifting, contact sports, etc  Refrain from any heavy lifting or straining until you are off narcotics for pain control.   °  c. DO NOT PUSH THROUGH PAIN.  Let pain be your guide: If it hurts to do something, don't do it.  Pain is your body warning you to avoid that activity for another week until the pain goes down. d. You may drive when you are no longer taking prescription pain medication, you can comfortably wear a seatbelt, and you can safely maneuver your car and apply brakes. e. Dennis Bast may have sexual intercourse  when it is comfortable.  8. FOLLOW UP in our office a. Please call [facility] at [phone] to set up an appointment to see your surgeon in the office for a follow-up appointment approximately 2-3 weeks after your surgery. b. Make sure that you call for this appointment the day you arrive home to insure a convenient appointment time. 9.  IF YOU HAVE DISABILITY OR FAMILY LEAVE FORMS, BRING THEM TO THE OFFICE FOR PROCESSING.  DO NOT GIVE THEM TO YOUR DOCTOR.  WHEN TO CALL us [phone]: 1. Poor pain control 2. Reactions / problems with new medications (rash/itching, nausea, etc)  3. Fever over 101.5 F (38.5 C) 4. Inability to urinate 5. Nausea and/or vomiting 6. Worsening swelling or bruising 7. Continued bleeding from incision. 8. Increased pain, redness, or drainage from the incision   The clinic staff is available to answer your questions during regular business hours (8:30am-5pm).  Please dont hesitate to call and ask to speak to one of our nurses for clinical concerns.   If you have a medical emergency, go to the nearest emergency room or call 911.  A surgeon from [facility] is always on call at the hospitals in [facility], [place], [place], [place], [place]  [address] ?  [address] [address], [place], [place]   [address] MAIN: [phone] ? TOLL FREE: [phone] ? FAX: [phone] [email or web address]   Patient Education Sheet  ON-Q Pain Relief System   What is the ON-Q?  The ON-Q is a balloon-type pump filled with a medicine to treat your pain. It blocks the pain in the area of your procedure. With ON-Q, you may have better pain relief and need less pain medicine. Its small size allows you to move around freely.  How does the ON-Q work?  The pump is attached to a catheter (small tube) near your procedure site. The pump automatically pushes the medicine through the tube. DO NOT squeeze the pump. The pump may be  clipped to your clothing or dressing or placed in a small carrying case.   How do I know the pump is working?  The pump gives your medicine very slowly. It may take longer than 24 hours after your procedure to notice a change in the size and look of the pump. Your pump may look like the picture.  In time, the outside bag on the pump will get looser and wrinkles will form in the bag.  Talk to your doctor about taking other pain medication as needed.  Check the following:  o Make sure the white clamp on the tubing is open (moves freely on the tubing).  o Make sure there are no kinks in the pump tubing.  o Do not tape or cover the filter.   What should I do with my ON-Q when sleeping?  Make sure the pump is placed on a bedside table or on top of bed covers.  Do not place the pump underneath the bed covers where the pump may become too  warm.  Do not place the pump on the floor or hang the pump on a bedpost.   Can I take a shower with ON-Q?  Your doctor will tell you when its ok for you to shower.  Take care to protect the catheter site, pump and filter from water.  Do not submerge the pump in water.   How long will my ON-Q last?  Depending on the size of your pump, it may take 4-5 days to give you all the medicine. All your medicine has been delivered when the outside bag is flat and a hard tube can be felt in the middle of the pump.   Troubleshooting  Tubing Disconnection  If the pump tubing becomes disconnected from your catheter, Remove all dressings and catheters and throw the On-Q pump away  Leaking  If leaking from the pump or pump tubing occurs, then remove all dressings and catheters and throw the On-Q pump away   When should I call the surgeon?  Be aware that you may experience loss of feeling (numbness) at and around the area of your procedure. If this occurs, be careful not to hurt yourself. Be careful when placing hot or cold items on the numb area.  The following symptoms  may represent a serious medical condition.  Immediately close the clamp on the pump tubing and call your doctor or 911 in case of emergency.  Increase in pain  Fever, chills, sweats  Bowel or bladder changes  Difficulty breathing  Redness, warmth, or discharge or excessive bleeding from the catheter site  Dizziness or lightheadedness  Blurred vision  Ringing or buzzing in your ears  Metal taste in your mouth  Numbness or tingling around your mouth, fingers, or toes  Drowsiness  Confusion   How to remove the ON-Q catheter?  If your doctor has instructed you to remove the ON-Q catheter, follow their instructions keeping in mind these steps:  -Remove the dressing covering the catheter site.  -Remove any skin adhesive strips.  -Grasp the catheter close to the skin and gently pull on the catheter. -It should be easy to remove and not painful.  -If it becomes difficult STOP and call your doctor.  -Do not cut or pull hard to remove the catheter.  -Once removed, check the catheter tip for a black marking to ensure the entire catheter was removed. Call your doctor if you do not see the black marking.  Place bandage over the catheter site as instructed by your doctor.   Source: [employer], ON-Q, and [employer] are registered trademarks and [employer] and [employer]DEMAND are trademarks of [employer].  2011 [employer]. All right reserved.  10/   STOP SMOKING!  We strongly recommend that you stop smoking.  Smoking increases the risk of surgery including infection in the form of an open wound, pus formation, abscess, hernia at an incision on the abdomen, etc.  You have an increased risk of other MAJOR complications such as stroke, heart attack, forming clots in the leg and/or lungs, and death.    Smoking Cessation Quitting smoking is important to your health and has many advantages. However, it is not always easy to quit since nicotine is a very addictive drug. Often times,  people try 3 times or more before being able to quit. This document explains the best ways for you to prepare to quit smoking. Quitting takes hard work and a lot of effort, but you can do it. ADVANTAGES OF QUITTING SMOKING  You  will live longer, feel better, and live better.  Your body will feel the impact of quitting smoking almost immediately.  Within 20 minutes, blood pressure decreases. Your pulse returns to its normal level.  After 8 hours, carbon monoxide levels in the blood return to normal. Your oxygen level increases.  After 24 hours, the chance of having a heart attack starts to decrease. Your breath, hair, and body stop smelling like smoke.  After 48 hours, damaged nerve endings begin to recover. Your sense of taste and smell improve.  After 72 hours, the body is virtually free of nicotine. Your bronchial tubes relax and breathing becomes easier.  After 2 to 12 weeks, lungs can hold more air. Exercise becomes easier and circulation improves.  The risk of having a heart attack, stroke, cancer, or lung disease is greatly reduced.  After 1 year, the risk of coronary heart disease is cut in half.  After 5 years, the risk of stroke falls to the same as a nonsmoker.  After 10 years, the risk of lung cancer is cut in half and the risk of other cancers decreases significantly.  After 15 years, the risk of coronary heart disease drops, usually to the level of a nonsmoker.  If you are pregnant, quitting smoking will improve your chances of having a healthy baby.  The people you live with, especially any children, will be healthier.  You will have extra money to spend on things other than cigarettes. QUESTIONS TO THINK ABOUT BEFORE ATTEMPTING TO QUIT You may want to talk about your answers with your caregiver.  Why do you want to quit?  If you tried to quit in the past, what helped and what did not?  What will be the most difficult situations for you after you quit? How will  you plan to handle them?  Who can help you through the tough times? Your family? Friends? A caregiver?  What pleasures do you get from smoking? What ways can you still get pleasure if you quit? Here are some questions to ask your caregiver:  How can you help me to be successful at quitting?  What medicine do you think would be best for me and how should I take it?  What should I do if I need more help?  What is smoking withdrawal like? How can I get information on withdrawal? GET READY  Set a quit date.  Change your environment by getting rid of all cigarettes, ashtrays, matches, and lighters in your home, car, or work. Do not let people smoke in your home.  Review your past attempts to quit. Think about what worked and what did not. GET SUPPORT AND ENCOURAGEMENT You have a better chance of being successful if you have help. You can get support in many ways.  Tell your family, friends, and co-workers that you are going to quit and need their support. Ask them not to smoke around you.  Get individual, group, or telephone counseling and support. Programs are available at [employer] and health centers. Call your local health department for information about programs in your area.  Spiritual beliefs and practices may help some smokers quit.  Download a "quit meter" on your computer to keep track of quit statistics, such as how long you have gone without smoking, cigarettes not smoked, and money saved.  Get a self-help book about quitting smoking and staying off of tobacco. Barn[place] yourself from urges to smoke. Talk to someone, go  for a walk, or occupy your time with a task.  Change your normal routine. Take a different route to work. Drink tea instead of coffee. Eat breakfast in a different place.  Reduce your stress. Take a hot bath, exercise, or read a book.  Plan something enjoyable to do every day. Reward yourself for not  smoking.  Explore interactive web-based programs that specialize in helping you quit. GET MEDICINE AND USE IT CORRECTLY Medicines can help you stop smoking and decrease the urge to smoke. Combining medicine with the above behavioral methods and support can greatly increase your chances of successfully quitting smoking.  Nicotine replacement therapy helps deliver nicotine to your body without the negative effects and risks of smoking. Nicotine replacement therapy includes nicotine gum, lozenges, inhalers, nasal sprays, and skin patches. Some may be available over-the-counter and others require a prescription.  Antidepressant medicine helps people abstain from smoking, but how this works is unknown. This medicine is available by prescription.  Nicotinic receptor partial agonist medicine simulates the effect of nicotine in your brain. This medicine is available by prescription. Ask your caregiver for advice about which medicines to use and how to use them based on your health history. Your caregiver will tell you what side effects to look out for if you choose to be on a medicine or therapy. Carefully read the information on the package. Do not use any other product containing nicotine while using a nicotine replacement product.  RELAPSE OR DIFFICULT SITUATIONS Most relapses occur within the first 3 months after quitting. Do not be discouraged if you start smoking again. Remember, most people try several times before finally quitting. You may have symptoms of withdrawal because your body is used to nicotine. You may crave cigarettes, be irritable, feel very hungry, cough often, get headaches, or have difficulty concentrating. The withdrawal symptoms are only temporary. They are strongest when you first quit, but they will go away within 10 14 days. To reduce the chances of relapse, try to:  Avoid drinking alcohol. Drinking lowers your chances of successfully quitting.  Reduce the amount of caffeine  you consume. Once you quit smoking, the amount of caffeine in your body increases and can give you symptoms, such as a rapid heartbeat, sweating, and anxiety.  Avoid smokers because they can make you want to smoke.  Do not let weight gain distract you. Many smokers will gain weight when they quit, usually less than 10 pounds. Eat a healthy diet and stay active. You can always lose the weight gained after you quit.  Find ways to improve your mood other than smoking. FOR MORE INFORMATION  www.smokefree.gov    While it can be one of the most difficult things to do, the [place] community has programs to help you stop.  Consider talking with your primary care physician about options.  Also, Smoking Cessation classes are available through the [facility]:  The smoking cessation program is a proven-effective program from the American Lung Association. The program is available for anyone 52 and older who currently smokes. The program lasts for 7 weeks and is 8 sessions. Each class will be approximately 1 1/2 hours. The program is every Tuesday.  All classes are 12-1:30pm and same location.  Event Location Information:  Location: [place] 2nd Floor Conference Room 2-037; located next to [facility] cross streets: [address] Entrance into the [facility] is adjacent to the [employer] main  entrance. The conference room is located on the 2nd floor.  Parking Instructions: Visitor parking is adjacent to [employer] main entrance and the [place]    A smoking cessation program is also offered through the [facility]. Register online at [email or web address] or call [phone] for more information.   Tobacco cessation counseling is available at [facility]. Call [phone] for a free appointment.   Tobacco cessation classes also are available through the [place] in [place]. For information, call [phone].   The Patient Education Network features videos on tobacco cessation. Please consult your listings in the center of this book to find instructions on how to access this resource.   If you want more information, ask your nurse.   Managing Pain  Pain after surgery or related to activity is often due to strain/injury to muscle, tendon, nerves and/or incisions.  This pain is usually short-term and will improve in a few months.   Many people find it helpful to do the following things TOGETHER to help speed the process of healing and to get back to regular activity more quickly:  1. Avoid heavy physical activity a.  no lifting greater than 20 pounds b. Do not push through the pain.  Listen to your body and avoid positions and maneuvers than reproduce the pain c. Walking is okay as tolerated, but go slowly and stop when getting sore.  d. Remember: If it hurts to do it, then dont do it! 2. Take Anti-inflammatory medication  a. Take with food/snack around the clock for 1-2 weeks i. This helps the muscle and nerve tissues become less irritable and calm down faster b. Choose ONE of the following over-the-counter medications: i. Naproxen 220mg  tabs (ex. Aleve) 1-2 pills twice a day  ii. Ibuprofen 200mg  tabs (ex. Advil, Motrin) 3-4 pills with every meal and just before bedtime iii. Acetaminophen 500mg  tabs (Tylenol) 1-2 pills with every meal and just before bedtime 3. Use a Heating pad or Ice/Cold Pack a. 4-6 times a day b. May use warm bath/hottub  or showers 4. Try Gentle Massage and/or Stretching  a. at the area of pain many times a day b. stop if you feel pain - do not overdo it  Try these steps together to help you body heal faster and avoid making things get worse.  Doing just one of these things may not be enough.    If you are not getting better after two weeks or are noticing you are getting worse, contact our office for  further advice; we may need to re-evaluate you & see what other things we can do to help.  GETTING TO GOOD BOWEL HEALTH. Irregular bowel habits such as constipation and diarrhea can lead to many problems over time.  Having one soft bowel movement a day is the most important way to prevent further problems.  The anorectal canal is designed to handle stretching and feces to safely manage our ability to get rid of solid waste (feces, poop, stool) out of our body.  BUT, hard constipated stools can act like ripping concrete bricks and diarrhea can be a burning fire to this very sensitive area of our body, causing inflamed hemorrhoids, anal fissures, increasing risk is perirectal abscesses, abdominal pain/bloating, an making irritable bowel worse.     The goal: ONE SOFT BOWEL MOVEMENT A DAY!  To have soft, regular bowel movements:    Drink at least 8 tall glasses of water a  day.     Take plenty of fiber.  Fiber is the undigested part of plant food that passes into the colon, acting s natures broom to encourage bowel motility and movement.  Fiber can absorb and hold large amounts of water. This results in a larger, bulkier stool, which is soft and easier to pass. Work gradually over several weeks up to 6 servings a day of fiber (25g a day even more if needed) in the form of: o Vegetables -- Root (potatoes, carrots, turnips), leafy green (lettuce, salad greens, celery, spinach), or cooked high residue (cabbage, broccoli, etc) o Fruit -- Fresh (unpeeled skin & pulp), Dried (prunes, apricots, cherries, etc ),  or stewed ( applesauce)  o Whole grain breads, pasta, etc (whole wheat)  o Bran cereals    Bulking Agents -- This type of water-retaining fiber generally is easily obtained each day by one of the following:  o Psyllium bran -- The psyllium plant is remarkable because its ground seeds can retain so much water. This product is available as Metamucil, Konsyl, Effersyllium, Per Diem Fiber, or the less  expensive generic preparation in drug and health food stores. Although labeled a laxative, it really is not a laxative.  o Methylcellulose -- This is another fiber derived from wood which also retains water. It is available as Citrucel. o Polyethylene Glycol - and artificial fiber commonly called Miralax or Glycolax.  It is helpful for people with gassy or bloated feelings with regular fiber o Flax Seed - a less gassy fiber than psyllium   No reading or other relaxing activity while on the toilet. If bowel movements take longer than 5 minutes, you are too constipated   AVOID CONSTIPATION.  High fiber and water intake usually takes care of this.  Sometimes a laxative is needed to stimulate more frequent bowel movements, but    Laxatives are not a good long-term solution as it can wear the colon out. o Osmotics (Milk of Magnesia, Fleets phosphosoda, Magnesium citrate, MiraLax, GoLytely) are safer than  o Stimulants (Senokot, Castor Oil, Dulcolax, Ex Lax)    o Do not take laxatives for more than 7days in a row.    IF SEVERELY CONSTIPATED, try a Bowel Retraining Program: o Do not use laxatives.  o Eat a diet high in roughage, such as bran cereals and leafy vegetables.  o Drink six (6) ounces of prune or apricot juice each morning.  o Eat two (2) large servings of stewed fruit each day.  o Take one (1) heaping tablespoon of a psyllium-based bulking agent twice a day. Use sugar-free sweetener when possible to avoid excessive calories.  o Eat a normal breakfast.  o Set aside 15 minutes after breakfast to sit on the toilet, but do not strain to have a bowel movement.  o If you do not have a bowel movement by the third day, use an enema and repeat the above steps.    Controlling diarrhea o Switch to liquids and simpler foods for a few days to avoid stressing your intestines further. o Avoid dairy products (especially milk & ice cream) for a short time.  The intestines often can lose the ability to  digest lactose when stressed. o Avoid foods that cause gassiness or bloating.  Typical foods include beans and other legumes, cabbage, broccoli, and dairy foods.  Every person has some sensitivity to other foods, so listen to our body and avoid those foods that trigger problems for you. o Adding fiber (Citrucel, Metamucil,  psyllium, Miralax) gradually can help thicken stools by absorbing excess fluid and retrain the intestines to act more normally.  Slowly increase the dose over a few weeks.  Too much fiber too soon can backfire and cause cramping & bloating. o Probiotics (such as active yogurt, Align, etc) may help repopulate the intestines and colon with normal bacteria and calm down a sensitive digestive tract.  Most studies show it to be of mild help, though, and such products can be costly. o Medicines:   Bismuth subsalicylate (ex. Kayopectate, Pepto Bismol) every 30 minutes for up to 6 doses can help control diarrhea.  Avoid if pregnant.   Loperamide (Immodium) can slow down diarrhea.  Start with two tablets (4mg  total) first and then try one tablet every 6 hours.  Avoid if you are having fevers or severe pain.  If you are not better or start feeling worse, stop all medicines and call your doctor for advice o Call your doctor if you are getting worse or not better.  Sometimes further testing (cultures, endoscopy, X-ray studies, bloodwork, etc) may be needed to help diagnose and treat the cause of the diarrhea. o

## 2013-12-05 ENCOUNTER — Encounter (HOSPITAL_COMMUNITY): Payer: Self-pay | Admitting: Surgery

## 2014-01-03 ENCOUNTER — Ambulatory Visit (INDEPENDENT_AMBULATORY_CARE_PROVIDER_SITE_OTHER): Payer: BC Managed Care – PPO | Admitting: Surgery

## 2014-01-03 ENCOUNTER — Encounter (INDEPENDENT_AMBULATORY_CARE_PROVIDER_SITE_OTHER): Payer: Self-pay | Admitting: Surgery

## 2014-01-03 ENCOUNTER — Encounter (INDEPENDENT_AMBULATORY_CARE_PROVIDER_SITE_OTHER): Payer: Self-pay

## 2014-01-03 VITALS — BP 128/82 | HR 81 | Temp 97.4°F | Ht 67.0 in | Wt 170.0 lb

## 2014-01-03 DIAGNOSIS — K430 Incisional hernia with obstruction, without gangrene: Secondary | ICD-10-CM

## 2014-01-03 NOTE — Progress Notes (Signed)
Subjective:     Patient ID: Kendra Fuller, female   DOB: [birth year], 48 y.o.   MRN: [id number]  HPI  Note: This dictation was prepared with Dragon/digital dictation along with [employer]. Any transcriptional errors that result from this process are unintentional.       RAMSEY MIDGETT  December 04, 1965 [id number]  Patient Care Team: Chipper Herb, MD as PCP - General (Family Medicine) Lear Ng, MD as Consulting Physician (Gastroenterology)  Procedure (Date: 12/03/2013):  POST-OPERATIVE DIAGNOSIS: Incarcerated incisional ventral hernias x4   PROCEDURE: Procedure(s):  LAPAROSCOPIC LYSIS OF ADHESIONS x 90 min  REPAIR OF INCARCERATED VENTRAL HERNIA WITH MESH   SURGEON: Surgeon(s):  Adin Hector, MD   This patient returns for surgical re-evaluation.  She is doing better.  She felt like she was "seeing things" on oxycodone.  She weaned herself off of it.  She has minimal soreness at her left side.  She is not taking any medicines for pain control per  She has been walking well.  She did a1/2 of a strenuous hike rather well.  Pausedw mild DOE but no abdominal pain.  She is hoping to get back to the gym for an aggressive exercise regimen to lose weight.  No problems with urination.  Moving her bowels fine.  No fevers chills or sweats.  Energy level good.  In good spirits.  Patient Active Problem List   Diagnosis Date Noted  . Incisional hernias x4, incarcerated s/p lap repaair w mesh 12/03/2013 10/21/2013  . Personal history of colonic polyps 10/21/2013  . Tobacco abuse 10/21/2013    Past Medical History  Diagnosis Date  . Rectal bleeding   . Personal history of colonic polyps 10/21/2013    Laparoscopic sigmoid colectomy July 2012  Collected Date: 03/08/2011 Received Date: 03/09/2011 Physician: Aaron Edelman D. 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 - SURGICAL RESECTION MARGINS, NEGATIVE FOR DYSPLASIA OR MALIGNANCY. - SIXTEEN LYMPH NODES, NEGATIVE FOR TUMOR (0/16). - ENDOSCOPIC TATTOO PRESENT [name] Pathologist, Electronic Signature (Case signed 03/10/2011) Specimen Aracelia Brinson and Clinical Information Specimen(s) Obtained: Colon, segmental resection for tumor, splenic flexor with polyp   . Congestion of upper airway     sinus and chest 1 week, fever one nite  . GERD (gastroesophageal reflux disease)     occ    Past Surgical History  Procedure Laterality Date  . Oophorectomy Left 97    also lft tube  . Laparoscopic sigmoid colectomy  03/08/2011  . Colon surgery    . Tubal ligation      left fallopian tube removed  . Ventral hernia repair N/A 12/03/2013    Procedure: LAPAROSCOPIC LYSIS OF ADHESIONS AND REPAIR OF INCARCERATED VENTRAL HERNIA WITH MESH;  Surgeon: Adin Hector, MD;  Location: [facility];  Service: General;  Laterality: N/A;    History   Social History  . Marital Status: Single    Spouse Name: N/A    Number of Children: N/A  . Years of Education: N/A   Occupational History  . Not on file.   Social History Main Topics  . Smoking status: Current Every Day Smoker -- 1.00 packs/day for 12 years    Types: Cigarettes  . Smokeless tobacco: Never Used  . Alcohol Use: Yes  Comment: occ  . Drug Use: No  . Sexual Activity: Not on file   Other Topics Concern  . Not on file   Social History Narrative  . No narrative on file    Family History  Problem Relation Age of Onset  . Cancer Mother     lung cancer  . Heart disease Father     Current Outpatient Prescriptions  Medication Sig Dispense Refill  . budesonide-formoterol (SYMBICORT) 160-4.5 MCG/ACT inhaler Inhale 2 puffs into the lungs once. Niece gave to her      . fluticasone (FLONASE) 50 MCG/ACT nasal spray Place 2 sprays into both nostrils  daily.      Marland Kitchen loratadine (CLARITIN) 10 MG tablet Take 10 mg by mouth daily as needed for allergies.       . naproxen (NAPROSYN) 500 MG tablet Take 1 tablet (500 mg total) by mouth 2 (two) times daily with a meal.  40 tablet  1  . Phenylephrine-Ibuprofen (ADVIL CONGESTION RELIEF PO) Take 1 capsule by mouth 2 (two) times daily as needed.       No current facility-administered medications for this visit.     No Known Allergies  BP 128/82  Pulse 81  Temp(Src) 97.4 F (36.3 C)  Ht 5\' 7"  (1.702 m)  Wt 170 lb (77.111 kg)  BMI 26.62 kg/m2  No results found.   Review of Systems  Constitutional: Negative for fever, chills and diaphoresis.  HENT: Negative for ear pain, sore throat and trouble swallowing.   Eyes: Negative for photophobia and visual disturbance.  Respiratory: Negative for cough and choking.   Cardiovascular: Negative for chest pain and palpitations.  Gastrointestinal: Negative for nausea, vomiting, diarrhea, constipation, blood in stool, abdominal distention, anal bleeding and rectal pain.  Genitourinary: Negative for dysuria, frequency and difficulty urinating.  Musculoskeletal: Negative for gait problem and myalgias.  Skin: Negative for color change, pallor and rash.  Neurological: Negative for dizziness, speech difficulty, weakness and numbness.  Hematological: Negative for adenopathy.  Psychiatric/Behavioral: Negative for confusion and agitation. The patient is not nervous/anxious.        Objective:   Physical Exam  Constitutional: She is oriented to person, place, and time. She appears well-developed and well-nourished. No distress.  HENT:  Head: Normocephalic.  Mouth/Throat: Oropharynx is clear and moist. No oropharyngeal exudate.  Eyes: Conjunctivae and EOM are normal. Pupils are equal, round, and reactive to light. No scleral icterus.  Neck: Normal range of motion. No tracheal deviation present.  Cardiovascular: Normal rate and intact distal pulses.     Pulmonary/Chest: Effort normal. No respiratory distress. She exhibits no tenderness.  Abdominal: Soft. She exhibits no distension. There is no tenderness. No hernia. Hernia confirmed negative in the ventral area, confirmed negative in the right inguinal area and confirmed negative in the left inguinal area.    Incisions clean with normal healing ridges.  No hernias  Genitourinary: No vaginal discharge found.  Musculoskeletal: Normal range of motion. She exhibits no tenderness.  Lymphadenopathy:       Right: No inguinal adenopathy present.       Left: No inguinal adenopathy present.  Neurological: She is alert and oriented to person, place, and time. No cranial nerve deficit. She exhibits normal muscle tone. Coordination normal.  Skin: Skin is warm and dry. No rash noted. She is not diaphoretic.  Psychiatric: She has a normal mood and affect. Her behavior is normal.       Assessment:     Recovering 4 weeks s/p  lap [facility] repair w mesh     Plan:     I went over the procedure again in detail.  Explained what I found an a repair with mesh.  Increase activity as tolerated to regular activity.  Low impact exercise such as walking an hour a day at least ideal.  Do not push through pain.  She had many questions about returning to the gym & exercises.  Tried to go over them piece by piece per her wishes.  Diet as tolerated.  Low fat high fiber diet ideal.  Bowel regimen with 30 g fiber a day and fiber supplement as needed to avoid problems.  Return to clinic as needed.   Instructions discussed.  Followup with primary care physician for other health issues as would normally be done.  Consider screening for malignancies (breast, prostate, colon, melanoma, etc) as appropriate.  Questions answered.  The patient expressed understanding and appreciation

## 2014-01-03 NOTE — Patient Instructions (Signed)
HERNIA REPAIR: POST OP INSTRUCTIONS  1. DIET: Follow a light bland diet the first 24 hours after arrival home, such as soup, liquids, crackers, etc.  Be sure to include lots of fluids daily.  Avoid fast food or heavy meals as your are more likely to get nauseated.  Eat a low fat the next few days after surgery. 2. Take your usually prescribed home medications unless otherwise directed. 3. PAIN CONTROL: a. Pain is best controlled by a usual combination of three different methods TOGETHER: i. Ice/Heat ii. Over the counter pain medication iii. Prescription pain medication b. Most patients will experience some swelling and bruising around the hernia(s) such as the bellybutton, groins, or old incisions.  Ice packs or heating pads (30-60 minutes up to 6 times a day) will help. Use ice for the first few days to help decrease swelling and bruising, then switch to heat to help relax tight/sore spots and speed recovery.  Some people prefer to use ice alone, heat alone, alternating between ice & heat.  Experiment to what works for you.  Swelling and bruising can take several weeks to resolve.   c. It is helpful to take an over-the-counter pain medication regularly for the first few weeks.  Choose one of the following that works best for you: i. Naproxen (Aleve, etc)  Two 253m tabs twice a day ii. Ibuprofen (Advil, etc) Three 2034mtabs four times a day (every meal & bedtime) iii. Acetaminophen (Tylenol, etc) 325-65032mour times a day (every meal & bedtime) d. A  prescription for pain medication should be given to you upon discharge.  Take your pain medication as prescribed.  i. If you are having problems/concerns with the prescription medicine (does not control pain, nausea, vomiting, rash, itching, etc), please call us [phone] see if we need to switch you to a different pain medicine that will work better for you and/or control your side effect better. ii. If you need a refill on your pain  medication, please contact your pharmacy.  They will contact our office to request authorization. Prescriptions will not be filled after 5 pm or on week-ends. 4. Avoid getting constipated.  Between the surgery and the pain medications, it is common to experience some constipation.  Increasing fluid intake and taking a fiber supplement (such as Metamucil, Citrucel, FiberCon, MiraLax, etc) 1-2 times a day regularly will usually help prevent this problem from occurring.  A mild laxative (prune juice, Milk of Magnesia, MiraLax, etc) should be taken according to package directions if there are no bowel movements after 48 hours.   5. Wash / shower every day.  You may shower over the dressings as they are waterproof.   6. Remove your waterproof bandages 5 days after surgery.  You may leave the incision open to air.  You may replace a dressing/Band-Aid to cover the incision for comfort if you wish.  Continue to shower over incision(s) after the dressing is off.    7. ACTIVITIES as tolerated:   a. You may resume regular (light) daily activities beginning the next day-such as daily self-care, walking, climbing stairs-gradually increasing activities as tolerated.  If you can walk 30 minutes without difficulty, it is safe to try more intense activity such as jogging, treadmill, bicycling, low-impact aerobics, swimming, etc. b. Save the most intensive and strenuous activity for last such as sit-ups, heavy lifting, contact sports, etc  Refrain from any heavy lifting or straining until you are off narcotics for pain control.  c. DO NOT PUSH THROUGH PAIN.  Let pain be your guide: If it hurts to do something, don't do it.  Pain is your body warning you to avoid that activity for another week until the pain goes down. d. You may drive when you are no longer taking prescription pain medication, you can comfortably wear a seatbelt, and you can safely maneuver your car and apply brakes. e. You may have sexual intercourse  when it is comfortable.  8. FOLLOW UP in our office a. Please call [facility] at [phone] to set up an appointment to see your surgeon in the office for a follow-up appointment approximately 2-3 weeks after your surgery. b. Make sure that you call for this appointment the day you arrive home to insure a convenient appointment time. 9.  IF YOU HAVE DISABILITY OR FAMILY LEAVE FORMS, BRING THEM TO THE OFFICE FOR PROCESSING.  DO NOT GIVE THEM TO YOUR DOCTOR.  WHEN TO CALL US [phone]: 1. Poor pain control 2. Reactions / problems with new medications (rash/itching, nausea, etc)  3. Fever over 101.5 F (38.5 C) 4. Inability to urinate 5. Nausea and/or vomiting 6. Worsening swelling or bruising 7. Continued bleeding from incision. 8. Increased pain, redness, or drainage from the incision   The clinic staff is available to answer your questions during regular business hours (8:30am-5pm).  Please don't hesitate to call and ask to speak to one of our nurses for clinical concerns.   If you have a medical emergency, go to the nearest emergency room or call 911.  A surgeon from [facility] is always on call at the hospitals in [place]  [facility] [address], [address], [place], [place]  [address] ?  [address] [address], [place], [place]   [address] MAIN: [phone] ? TOLL FREE: [phone] ? FAX: [phone] [email or web address]  Exercise to Stay Healthy Exercise helps you become and stay healthy. EXERCISE IDEAS AND TIPS Choose exercises that:  You enjoy.  Fit into your day. You do not need to exercise really hard to be healthy. You can do exercises at a slow or medium level and stay healthy. You can:  Stretch before and after working out.  Try yoga, Pilates, or tai chi.  Lift weights.  Walk fast, swim, jog, run, climb stairs, bicycle, dance, or rollerskate.  Take aerobic classes. Exercises that burn about 150 calories:  Running  1  miles in 15 minutes.  Playing volleyball for 45 to 60 minutes.  Washing and waxing a car for 45 to 60 minutes.  Playing touch football for 45 minutes.  Walking 1  miles in 35 minutes.  Pushing a stroller 1  miles in 30 minutes.  Playing basketball for 30 minutes.  Raking leaves for 30 minutes.  Bicycling 5 miles in 30 minutes.  Walking 2 miles in 30 minutes.  Dancing for 30 minutes.  Shoveling snow for 15 minutes.  Swimming laps for 20 minutes.  Walking up stairs for 15 minutes.  Bicycling 4 miles in 15 minutes.  Gardening for 30 to 45 minutes.  Jumping rope for 15 minutes.  Washing windows or floors for 45 to 60 minutes. Document Released: 08/27/2010 Document Revised: 10/17/2011 Document Reviewed: 08/27/2010 [employer] Patient Information 2014 [employer].  

## 2014-04-29 ENCOUNTER — Ambulatory Visit (INDEPENDENT_AMBULATORY_CARE_PROVIDER_SITE_OTHER): Payer: BC Managed Care – PPO | Admitting: Family Medicine

## 2014-04-29 ENCOUNTER — Encounter: Payer: Self-pay | Admitting: Family Medicine

## 2014-04-29 VITALS — BP 119/74 | HR 90 | Temp 97.4°F | Ht 67.0 in | Wt 173.4 lb

## 2014-04-29 DIAGNOSIS — J069 Acute upper respiratory infection, unspecified: Secondary | ICD-10-CM

## 2014-04-29 DIAGNOSIS — Z0000 Encounter for general adult medical examination without abnormal findings: Secondary | ICD-10-CM

## 2014-04-29 DIAGNOSIS — R609 Edema, unspecified: Secondary | ICD-10-CM

## 2014-04-29 LAB — POCT CBC
Granulocyte percent: 63.7 %G (ref 37–80)
HCT, POC: 44.5 % (ref 37.7–47.9)
Hemoglobin: 15 g/dL (ref 12.2–16.2)
Lymph, poc: 3.1 (ref 0.6–3.4)
MCH, POC: 31.1 pg (ref 27–31.2)
MCHC: 33.7 g/dL (ref 31.8–35.4)
MCV: 92.2 fL (ref 80–97)
MPV: 9.2 fL (ref 0–99.8)
POC Granulocyte: 5.8 (ref 2–6.9)
POC LYMPH PERCENT: 34 %L (ref 10–50)
Platelet Count, POC: 225 10*3/uL (ref 142–424)
RBC: 4.8 M/uL (ref 4.04–5.48)
RDW, POC: 12.8 %
WBC: 9.1 10*3/uL (ref 4.6–10.2)

## 2014-04-29 LAB — POCT URINALYSIS DIPSTICK
Bilirubin, UA: NEGATIVE
Glucose, UA: NEGATIVE
Ketones, UA: NEGATIVE
Nitrite, UA: NEGATIVE
Spec Grav, UA: <=1.005
Urobilinogen, UA: NEGATIVE
pH, UA: 5

## 2014-04-29 LAB — POCT UA - MICROSCOPIC ONLY
Casts, Ur, LPF, POC: NEGATIVE
Crystals, Ur, HPF, POC: NEGATIVE
Mucus, UA: NEGATIVE
Yeast, UA: NEGATIVE

## 2014-04-29 MED ORDER — AMOXICILLIN 875 MG PO TABS: 875.0000 mg | ORAL_TABLET | Freq: Two times a day (BID) | ORAL | Status: DC

## 2014-04-29 MED ORDER — FUROSEMIDE 20 MG PO TABS: ORAL_TABLET | ORAL | Status: DC

## 2014-04-29 MED ORDER — METHYLPREDNISOLONE ACETATE 80 MG/ML IJ SUSP
80.0000 mg | Freq: Once | INTRAMUSCULAR | Status: AC
  Administered 2014-04-29: 80 mg via INTRAMUSCULAR

## 2014-04-29 NOTE — Progress Notes (Signed)
   Subjective:    Patient ID: Kendra Fuller, female    DOB: April 20, 1966, 48 y.o.   MRN: [id number]  HPI This 48 y.o. female presents for evaluation of swelling and edema in her legs and feet.  She has been feeling washed out and tired. She has been having some wheezing and some coughing and URI sx's.  She is a smoker.  She wants a complete blood work panel to be drawn. She has not had a CPE in awhile.  She denies Chest pain.   Review of Systems C/o edema and uri sx's No chest pain, SOB, HA, dizziness, vision change, N/V, diarrhea, constipation, dysuria, urinary urgency or frequency, myalgias, arthralgias or rash.     Objective:   Physical Exam Vital signs noted  Well developed well nourished female.  HEENT - Head atraumatic Normocephalic                Eyes - PERRLA, Conjuctiva - clear Sclera- Clear EOMI                Ears - EAC's Wnl TM's Wnl Gross Hearing WNL                Nose - Nares patent                 Throat - oropharanx wnl Respiratory - Lungs CTA bilateral Cardiac - RRR S1 and S2 without murmur GI - Abdomen soft Nontender and bowel sounds active x 4 Extremities - bilateral pre-tibial edema and pedal edema Neuro - Grossly intact.       Assessment & Plan:  Edema - Plan: furosemide (LASIX) 20 MG tablet po qd for 2-3 days prn swelling  Routine general medical examination at a health care facility - Plan: POCT CBC, CMP14+EGFR, Lipid panel, Thyroid Panel With TSH, Vit D  25 hydroxy (rtn osteoporosis monitoring), POCT UA - Microscopic Only, POCT urinalysis dipstick  URI (upper respiratory infection) - Plan: amoxicillin (AMOXIL) 875 MG tablet, methylPREDNISolone acetate (DEPO-MEDROL) injection 80 mg Push po fluids, rest, tylenol and motrin otc prn as directed for fever, arthralgias, and myalgias.  Follow up prn if sx's continue or persist.  Lysbeth Penner FNP

## 2014-04-30 LAB — CMP14+EGFR
ALT: 20 IU/L (ref 0–32)
AST: 20 IU/L (ref 0–40)
Albumin/Globulin Ratio: 1.7 (ref 1.1–2.5)
Albumin: 4.3 g/dL (ref 3.5–5.5)
Alkaline Phosphatase: 87 IU/L (ref 39–117)
BUN/Creatinine Ratio: 8 — ABNORMAL LOW (ref 9–23)
BUN: 6 mg/dL (ref 6–24)
CO2: 22 mmol/L (ref 18–29)
Calcium: 9.2 mg/dL (ref 8.7–10.2)
Chloride: 99 mmol/L (ref 97–108)
Creatinine, Ser: 0.76 mg/dL (ref 0.57–1.00)
GFR calc Af Amer: 108 mL/min/{1.73_m2} (ref >59)
GFR calc non Af Amer: 94 mL/min/{1.73_m2} (ref >59)
Globulin, Total: 2.5 g/dL (ref 1.5–4.5)
Glucose: 91 mg/dL (ref 65–99)
Potassium: 4.4 mmol/L (ref 3.5–5.2)
Sodium: 139 mmol/L (ref 134–144)
Total Bilirubin: 0.4 mg/dL (ref 0.0–1.2)
Total Protein: 6.8 g/dL (ref 6.0–8.5)

## 2014-04-30 LAB — LIPID PANEL
Chol/HDL Ratio: 6.5 ratio units — ABNORMAL HIGH (ref 0.0–4.4)
Cholesterol, Total: 252 mg/dL — ABNORMAL HIGH (ref 100–199)
HDL: 39 mg/dL — ABNORMAL LOW (ref >39)
LDL Calculated: 166 mg/dL — ABNORMAL HIGH (ref 0–99)
Triglycerides: 237 mg/dL — ABNORMAL HIGH (ref 0–149)
VLDL Cholesterol Cal: 47 mg/dL — ABNORMAL HIGH (ref 5–40)

## 2014-04-30 LAB — THYROID PANEL WITH TSH
Free Thyroxine Index: 1.6 (ref 1.2–4.9)
T3 Uptake Ratio: 24 % (ref 24–39)
T4, Total: 6.6 ug/dL (ref 4.5–12.0)
TSH: 4.81 u[IU]/mL — ABNORMAL HIGH (ref 0.450–4.500)

## 2014-04-30 LAB — VITAMIN D 25 HYDROXY (VIT D DEFICIENCY, FRACTURES): Vit D, 25-Hydroxy: 24.5 ng/mL — ABNORMAL LOW (ref 30.0–100.0)

## 2014-05-02 ENCOUNTER — Other Ambulatory Visit: Payer: Self-pay | Admitting: Family Medicine

## 2014-05-02 ENCOUNTER — Telehealth: Payer: Self-pay | Admitting: *Deleted

## 2014-05-02 ENCOUNTER — Telehealth: Payer: Self-pay | Admitting: Family

## 2014-05-02 MED ORDER — VITAMIN D (ERGOCALCIFEROL) 1.25 MG (50000 UNIT) PO CAPS: 50000.0000 [IU] | ORAL_CAPSULE | ORAL | Status: DC

## 2014-05-02 MED ORDER — PRAVASTATIN SODIUM 40 MG PO TABS: 40.0000 mg | ORAL_TABLET | Freq: Every day | ORAL | Status: DC

## 2014-05-02 MED ORDER — LEVOTHYROXINE SODIUM 25 MCG PO TABS: 25.0000 ug | ORAL_TABLET | Freq: Every day | ORAL | Status: DC

## 2014-05-02 NOTE — Telephone Encounter (Signed)
Patient will begin thyroid medicine and follow up for recheck.  She refuses to start statins and does not want script for Vitamin D.  She will watch diet.

## 2014-05-02 NOTE — Telephone Encounter (Signed)
Patient decided to start the vitamin D.

## 2014-05-02 NOTE — Telephone Encounter (Signed)
Message copied by Shelbie Ammons on Fri May 02, 2014 11:55 AM ------      Message from: Lysbeth Penner      Created: Fri May 02, 2014  8:59 AM       Vitamin D is low and would recommend taking vitamin D 1000 - 2000 iu otc qd and then rx for weekly vitamin D is sent to pharmacy.  Lipids are elevated and rx for pravachol sent to pharmacy.  TSH is elevated showing hypothyroidism and levothyroxine 25 mcg po qd sent to pharm.  Would recheck FLP and LFT along with TSH and vit D in 3 months. ------

## 2014-10-27 ENCOUNTER — Ambulatory Visit (INDEPENDENT_AMBULATORY_CARE_PROVIDER_SITE_OTHER): Payer: BLUE CROSS/BLUE SHIELD | Admitting: Family

## 2014-10-27 ENCOUNTER — Encounter: Payer: Self-pay | Admitting: Family

## 2014-10-27 VITALS — BP 129/83 | HR 90 | Temp 97.9°F | Ht 67.0 in | Wt 178.0 lb

## 2014-10-27 DIAGNOSIS — E039 Hypothyroidism, unspecified: Secondary | ICD-10-CM

## 2014-10-27 DIAGNOSIS — Z1321 Encounter for screening for nutritional disorder: Secondary | ICD-10-CM | POA: Diagnosis not present

## 2014-10-27 DIAGNOSIS — E079 Disorder of thyroid, unspecified: Secondary | ICD-10-CM | POA: Insufficient documentation

## 2014-10-27 DIAGNOSIS — J209 Acute bronchitis, unspecified: Secondary | ICD-10-CM | POA: Diagnosis not present

## 2014-10-27 MED ORDER — ALBUTEROL SULFATE HFA 108 (90 BASE) MCG/ACT IN AERS: 2.0000 | INHALATION_SPRAY | Freq: Four times a day (QID) | RESPIRATORY_TRACT | Status: DC | PRN

## 2014-10-27 MED ORDER — AMOXICILLIN-POT CLAVULANATE 875-125 MG PO TABS: 1.0000 | ORAL_TABLET | Freq: Two times a day (BID) | ORAL | Status: DC

## 2014-10-27 MED ORDER — METHYLPREDNISOLONE (PAK) 4 MG PO TABS: ORAL_TABLET | ORAL | Status: DC

## 2014-10-27 NOTE — Progress Notes (Signed)
Subjective:    Patient ID: COLEEN CARDIFF, female    DOB: 1965/12/10, 49 y.o.   MRN: [id number]  URI  This is a new problem. The current episode started more than 1 month ago. The problem has been unchanged. There has been no fever. Associated symptoms include congestion, coughing, ear pain, a plugged ear sensation, rhinorrhea, sinus pain, sneezing and wheezing. Pertinent negatives include no diarrhea, headaches, nausea, sore throat or vomiting. She has tried NSAIDs and decongestant for the symptoms. The treatment provided mild relief.  Thyroid Problem Presents for follow-up visit. Patient reports no anxiety, constipation, depressed mood, diarrhea, dry skin, heat intolerance, hoarse voice, palpitations or visual change. The symptoms have been stable. Past treatments include levothyroxine. The treatment provided significant relief.      Review of Systems  Constitutional: Negative.   HENT: Positive for congestion, ear pain, rhinorrhea and sneezing. Negative for hoarse voice and sore throat.   Eyes: Negative.   Respiratory: Positive for cough and wheezing. Negative for shortness of breath.   Cardiovascular: Negative.  Negative for palpitations.  Gastrointestinal: Negative.  Negative for nausea, vomiting, diarrhea and constipation.  Endocrine: Negative.  Negative for heat intolerance.  Genitourinary: Negative.   Musculoskeletal: Negative.   Neurological: Negative.  Negative for headaches.  Hematological: Negative.   Psychiatric/Behavioral: Negative.  The patient is not nervous/anxious.   All other systems reviewed and are negative.      Objective:   Physical Exam  Constitutional: She is oriented to person, place, and time. She appears well-developed and well-nourished. No distress.  HENT:  Head: Normocephalic and atraumatic.  Right Ear: External ear normal.  Left Ear: External ear normal.  Nasal passage erythemas with mild swelling  Oropharynx erythemas  Eyes: Pupils are equal,  round, and reactive to light.  Neck: Normal range of motion. Neck supple. No thyromegaly present.  Cardiovascular: Normal rate, regular rhythm, normal heart sounds and intact distal pulses.   No murmur heard. Pulmonary/Chest: Effort normal and breath sounds normal. No respiratory distress. She has no wheezes.  Abdominal: Soft. Bowel sounds are normal. She exhibits no distension. There is no tenderness.  Musculoskeletal: Normal range of motion. She exhibits no edema or tenderness.  Neurological: She is alert and oriented to person, place, and time. She has normal reflexes. A cranial nerve deficit is present.  Skin: Skin is warm and dry.  Psychiatric: She has a normal mood and affect. Her behavior is normal. Judgment and thought content normal.  Vitals reviewed.  BP 129/83 mmHg  Pulse 90  Temp(Src) 97.9 F (36.6 C) (Oral)  Ht '5\' 7"'  (1.702 m)  Wt 178 lb (80.74 kg)  BMI 27.87 kg/m2       Assessment & Plan:  1. Hypothyroidism, unspecified hypothyroidism type - CMP14+EGFR - Thyroid Panel With TSH  2. Acute bronchitis, unspecified organism -- Take meds as prescribed - Use a cool mist humidifier  -Use saline nose sprays frequently -Saline irrigations of the nose can be very helpful if done frequently.  * 4X daily for 1 week*  * Use of a nettie pot can be helpful with this. Follow directions with this* -Force fluids -For any cough or congestion  Use plain Mucinex- regular strength or max strength is fine  * for fevers greater than 101 orally you may alternate ibuprofen and tylenol every  3 hours. -Throat lozenges if help  amoxicillin-clavulanate (AUGMENTIN) 875-125 MG per tablet; Take 1 tablet by mouth 2 (two) times daily.  Dispense: 14 tablet; Refill: 0 -  methylPREDNIsolone (MEDROL DOSPACK) 4 MG tablet; follow package directions  Dispense: 21 tablet; Refill: 0 - albuterol (PROVENTIL HFA;VENTOLIN HFA) 108 (90 BASE) MCG/ACT inhaler; Inhale 2 puffs into the lungs every 6 (six) hours as  needed for wheezing or shortness of breath.  Dispense: 1 Inhaler; Refill: 2 - CMP14+EGFR  3. Encounter for vitamin deficiency screening - CMP14+EGFR - Vit D  25 hydroxy (rtn osteoporosis monitoring)   Continue all meds Labs pending Health Maintenance reviewed Diet and exercise encouraged RTO 6 months  [name], FNP

## 2014-10-27 NOTE — Patient Instructions (Signed)
Health Maintenance Adopting a healthy lifestyle and getting preventive care can go a long way to promote health and wellness. Talk with your health care provider about what schedule of regular examinations is right for you. This is a good chance for you to check in with your provider about disease prevention and staying healthy. In between checkups, there are plenty of things you can do on your own. Experts have done a lot of research about which lifestyle changes and preventive measures are most likely to keep you healthy. Ask your health care provider for more information. WEIGHT AND DIET  Eat a healthy diet  Be sure to include plenty of vegetables, fruits, low-fat dairy products, and lean protein.  Do not eat a lot of foods high in solid fats, added sugars, or salt.  Get regular exercise. This is one of the most important things you can do for your health.  Most adults should exercise for at least 150 minutes each week. The exercise should increase your heart rate and make you sweat (moderate-intensity exercise).  Most adults should also do strengthening exercises at least twice a week. This is in addition to the moderate-intensity exercise.  Maintain a healthy weight  Body mass index (BMI) is a measurement that can be used to identify possible weight problems. It estimates body fat based on height and weight. Your health care provider can help determine your BMI and help you achieve or maintain a healthy weight.  For females 25 years of age and older:   A BMI below 18.5 is considered underweight.  A BMI of 18.5 to 24.9 is normal.  A BMI of 25 to 29.9 is considered overweight.  A BMI of 30 and above is considered obese.  Watch levels of cholesterol and blood lipids  You should start having your blood tested for lipids and cholesterol at 49 years of age, then have this test every 5 years.  You may need to have your cholesterol levels checked more often if:  Your lipid or  cholesterol levels are high.  You are older than 49 years of age.  You are at high risk for heart disease.  CANCER SCREENING   Lung Cancer  Lung cancer screening is recommended for adults 97-92 years old who are at high risk for lung cancer because of a history of smoking.  A yearly low-dose CT scan of the lungs is recommended for people who:  Currently smoke.  Have quit within the past 15 years.  Have at least a 30-pack-year history of smoking. A pack year is smoking an average of one pack of cigarettes a day for 1 year.  Yearly screening should continue until it has been 15 years since you quit.  Yearly screening should stop if you develop a health problem that would prevent you from having lung cancer treatment.  Breast Cancer  Practice breast self-awareness. This means understanding how your breasts normally appear and feel.  It also means doing regular breast self-exams. Let your health care provider know about any changes, no matter how small.  If you are in your 20s or 30s, you should have a clinical breast exam (CBE) by a health care provider every 1-3 years as part of a regular health exam.  If you are 76 or older, have a CBE every year. Also consider having a breast X-ray (mammogram) every year.  If you have a family history of breast cancer, talk to your health care provider about genetic screening.  If you are  at high risk for breast cancer, talk to your health care provider about having an MRI and a mammogram every year.  Breast cancer gene (BRCA) assessment is recommended for women who have family members with BRCA-related cancers. BRCA-related cancers include:  Breast.  Ovarian.  Tubal.  Peritoneal cancers.  Results of the assessment will determine the need for genetic counseling and BRCA1 and BRCA2 testing. Cervical Cancer Routine pelvic examinations to screen for cervical cancer are no longer recommended for nonpregnant women who are considered low  risk for cancer of the pelvic organs (ovaries, uterus, and vagina) and who do not have symptoms. A pelvic examination may be necessary if you have symptoms including those associated with pelvic infections. Ask your health care provider if a screening pelvic exam is right for you.   The Pap test is the screening test for cervical cancer for women who are considered at risk.  If you had a hysterectomy for a problem that was not cancer or a condition that could lead to cancer, then you no longer need Pap tests.  If you are older than 65 years, and you have had normal Pap tests for the past 10 years, you no longer need to have Pap tests.  If you have had past treatment for cervical cancer or a condition that could lead to cancer, you need Pap tests and screening for cancer for at least 20 years after your treatment.  If you no longer get a Pap test, assess your risk factors if they change (such as having a new sexual partner). This can affect whether you should start being screened again.  Some women have medical problems that increase their chance of getting cervical cancer. If this is the case for you, your health care provider may recommend more frequent screening and Pap tests.  The human papillomavirus (HPV) test is another test that may be used for cervical cancer screening. The HPV test looks for the virus that can cause cell changes in the cervix. The cells collected during the Pap test can be tested for HPV.  The HPV test can be used to screen women 30 years of age and older. Getting tested for HPV can extend the interval between normal Pap tests from three to five years.  An HPV test also should be used to screen women of any age who have unclear Pap test results.  After 49 years of age, women should have HPV testing as often as Pap tests.  Colorectal Cancer  This type of cancer can be detected and often prevented.  Routine colorectal cancer screening usually begins at 50 years of  age and continues through 49 years of age.  Your health care provider may recommend screening at an earlier age if you have risk factors for colon cancer.  Your health care provider may also recommend using home test kits to check for hidden blood in the stool.  A small camera at the end of a tube can be used to examine your colon directly (sigmoidoscopy or colonoscopy). This is done to check for the earliest forms of colorectal cancer.  Routine screening usually begins at age 50.  Direct examination of the colon should be repeated every 5-10 years through 49 years of age. However, you may need to be screened more often if early forms of precancerous polyps or small growths are found. Skin Cancer  Check your skin from head to toe regularly.  Tell your health care provider about any new moles or changes in   moles, especially if there is a change in a mole's shape or color.  Also tell your health care provider if you have a mole that is larger than the size of a pencil eraser.  Always use sunscreen. Apply sunscreen liberally and repeatedly throughout the day.  Protect yourself by wearing long sleeves, pants, a wide-brimmed hat, and sunglasses whenever you are outside. HEART DISEASE, DIABETES, AND HIGH BLOOD PRESSURE   Have your blood pressure checked at least every 1-2 years. High blood pressure causes heart disease and increases the risk of stroke.  If you are between 75 years and 42 years old, ask your health care provider if you should take aspirin to prevent strokes.  Have regular diabetes screenings. This involves taking a blood sample to check your fasting blood sugar level.  If you are at a normal weight and have a low risk for diabetes, have this test once every three years after 49 years of age.  If you are overweight and have a high risk for diabetes, consider being tested at a younger age or more often. PREVENTING INFECTION  Hepatitis B  If you have a higher risk for  hepatitis B, you should be screened for this virus. You are considered at high risk for hepatitis B if:  You were born in a country where hepatitis B is common. Ask your health care provider which countries are considered high risk.  Your parents were born in a high-risk country, and you have not been immunized against hepatitis B (hepatitis B vaccine).  You have HIV or AIDS.  You use needles to inject street drugs.  You live with someone who has hepatitis B.  You have had sex with someone who has hepatitis B.  You get hemodialysis treatment.  You take certain medicines for conditions, including cancer, organ transplantation, and autoimmune conditions. Hepatitis C  Blood testing is recommended for:  Everyone born from [birth year] through 1965.  Anyone with known risk factors for hepatitis C. Sexually transmitted infections (STIs)  You should be screened for sexually transmitted infections (STIs) including gonorrhea and chlamydia if:  You are sexually active and are younger than 48 years of age.  You are older than 49 years of age and your health care provider tells you that you are at risk for this type of infection.  Your sexual activity has changed since you were last screened and you are at an increased risk for chlamydia or gonorrhea. Ask your health care provider if you are at risk.  If you do not have HIV, but are at risk, it may be recommended that you take a prescription medicine daily to prevent HIV infection. This is called pre-exposure prophylaxis (PrEP). You are considered at risk if:  You are sexually active and do not regularly use condoms or know the HIV status of your partner(s).  You take drugs by injection.  You are sexually active with a partner who has HIV. Talk with your health care provider about whether you are at high risk of being infected with HIV. If you choose to begin PrEP, you should first be tested for HIV. You should then be tested every 3 months for  as long as you are taking PrEP.  PREGNANCY   If you are premenopausal and you may become pregnant, ask your health care provider about preconception counseling.  If you may become pregnant, take 400 to 800 micrograms (mcg) of folic acid every day.  If you want to prevent pregnancy, talk to your  health care provider about birth control (contraception). OSTEOPOROSIS AND MENOPAUSE   Osteoporosis is a disease in which the bones lose minerals and strength with aging. This can result in serious bone fractures. Your risk for osteoporosis can be identified using a bone density scan.  If you are 73 years of age or older, or if you are at risk for osteoporosis and fractures, ask your health care provider if you should be screened.  Ask your health care provider whether you should take a calcium or vitamin D supplement to lower your risk for osteoporosis.  Menopause may have certain physical symptoms and risks.  Hormone replacement therapy may reduce some of these symptoms and risks. Talk to your health care provider about whether hormone replacement therapy is right for you.  HOME CARE INSTRUCTIONS   Schedule regular health, dental, and eye exams.  Stay current with your immunizations.   Do not use any tobacco products including cigarettes, chewing tobacco, or electronic cigarettes.  If you are pregnant, do not drink alcohol.  If you are breastfeeding, limit how much and how often you drink alcohol.  Limit alcohol intake to no more than 1 drink per day for nonpregnant women. One drink equals 12 ounces of beer, 5 ounces of wine, or 1 ounces of hard liquor.  Do not use street drugs.  Do not share needles.  Ask your health care provider for help if you need support or information about quitting drugs.  Tell your health care provider if you often feel depressed.  Tell your health care provider if you have ever been abused or do not feel safe at home. Document Released: 02/07/2011  Document Revised: 12/09/2013 Document Reviewed: 06/26/2013 [facility] Patient Information 2015 [place], [place]. This information is not intended to replace advice given to you by your health care provider. Make sure you discuss any questions you have with your health care provider. Sinusitis Sinusitis is redness, soreness, and inflammation of the paranasal sinuses. Paranasal sinuses are air pockets within the bones of your face (beneath the eyes, the middle of the forehead, or above the eyes). In healthy paranasal sinuses, mucus is able to drain out, and air is able to circulate through them by way of your nose. However, when your paranasal sinuses are inflamed, mucus and air can become trapped. This can allow bacteria and other germs to grow and cause infection. Sinusitis can develop quickly and last only a short time (acute) or continue over a long period (chronic). Sinusitis that lasts for more than 12 weeks is considered chronic.  CAUSES  Causes of sinusitis include:  Allergies.  Structural abnormalities, such as displacement of the cartilage that separates your nostrils (deviated septum), which can decrease the air flow through your nose and sinuses and affect sinus drainage.  Functional abnormalities, such as when the small hairs (cilia) that line your sinuses and help remove mucus do not work properly or are not present. SIGNS AND SYMPTOMS  Symptoms of acute and chronic sinusitis are the same. The primary symptoms are pain and pressure around the affected sinuses. Other symptoms include:  Upper toothache.  Earache.  Headache.  Bad breath.  Decreased sense of smell and taste.  A cough, which worsens when you are lying flat.  Fatigue.  Fever.  Thick drainage from your nose, which often is green and may contain pus (purulent).  Swelling and warmth over the affected sinuses. DIAGNOSIS  Your health care provider will perform a physical exam. During the exam, your health care  provider may:  Look in your nose for signs of abnormal growths in your nostrils (nasal polyps).  Tap over the affected sinus to check for signs of infection.  View the inside of your sinuses (endoscopy) using an imaging device that has a light attached (endoscope). If your health care provider suspects that you have chronic sinusitis, one or more of the following tests may be recommended:  Allergy tests.  Nasal culture. A sample of mucus is taken from your nose, sent to a lab, and screened for bacteria.  Nasal cytology. A sample of mucus is taken from your nose and examined by your health care provider to determine if your sinusitis is related to an allergy. TREATMENT  Most cases of acute sinusitis are related to a viral infection and will resolve on their own within 10 days. Sometimes medicines are prescribed to help relieve symptoms (pain medicine, decongestants, nasal steroid sprays, or saline sprays).  However, for sinusitis related to a bacterial infection, your health care provider will prescribe antibiotic medicines. These are medicines that will help kill the bacteria causing the infection.  Rarely, sinusitis is caused by a fungal infection. In theses cases, your health care provider will prescribe antifungal medicine. For some cases of chronic sinusitis, surgery is needed. Generally, these are cases in which sinusitis recurs more than 3 times per year, despite other treatments. HOME CARE INSTRUCTIONS   Drink plenty of water. Water helps thin the mucus so your sinuses can drain more easily.  Use a humidifier.  Inhale steam 3 to 4 times a day (for example, sit in the bathroom with the shower running).  Apply a warm, moist washcloth to your face 3 to 4 times a day, or as directed by your health care provider.  Use saline nasal sprays to help moisten and clean your sinuses.  Take medicines only as directed by your health care provider.  If you were prescribed either an  antibiotic or antifungal medicine, finish it all even if you start to feel better. SEEK IMMEDIATE MEDICAL CARE IF:  You have increasing pain or severe headaches.  You have nausea, vomiting, or drowsiness.  You have swelling around your face.  You have vision problems.  You have a stiff neck.  You have difficulty breathing. MAKE SURE YOU:   Understand these instructions.  Will watch your condition.  Will get help right away if you are not doing well or get worse. Document Released: 07/25/2005 Document Revised: 12/09/2013 Document Reviewed: 08/09/2011 [facility] Patient Information 2015 [place], [place]. This information is not intended to replace advice given to you by your health care provider. Make sure you discuss any questions you have with your health care provider.

## 2014-10-28 ENCOUNTER — Other Ambulatory Visit: Payer: Self-pay | Admitting: Family

## 2014-10-28 DIAGNOSIS — E559 Vitamin D deficiency, unspecified: Secondary | ICD-10-CM

## 2014-10-28 LAB — CMP14+EGFR
ALT: 18 IU/L (ref 0–32)
AST: 17 IU/L (ref 0–40)
Albumin/Globulin Ratio: 1.8 (ref 1.1–2.5)
Albumin: 4.3 g/dL (ref 3.5–5.5)
Alkaline Phosphatase: 78 IU/L (ref 39–117)
BUN/Creatinine Ratio: 13 (ref 9–23)
BUN: 9 mg/dL (ref 6–24)
Bilirubin Total: 0.2 mg/dL (ref 0.0–1.2)
CO2: 24 mmol/L (ref 18–29)
Calcium: 9.1 mg/dL (ref 8.7–10.2)
Chloride: 101 mmol/L (ref 97–108)
Creatinine, Ser: 0.68 mg/dL (ref 0.57–1.00)
GFR calc Af Amer: 120 mL/min/{1.73_m2} (ref >59)
GFR calc non Af Amer: 104 mL/min/{1.73_m2} (ref >59)
Globulin, Total: 2.4 g/dL (ref 1.5–4.5)
Glucose: 95 mg/dL (ref 65–99)
Potassium: 4.3 mmol/L (ref 3.5–5.2)
Sodium: 140 mmol/L (ref 134–144)
Total Protein: 6.7 g/dL (ref 6.0–8.5)

## 2014-10-28 LAB — THYROID PANEL WITH TSH
Free Thyroxine Index: 2 (ref 1.2–4.9)
T3 Uptake Ratio: 26 % (ref 24–39)
T4, Total: 7.6 ug/dL (ref 4.5–12.0)
TSH: 6.01 u[IU]/mL — ABNORMAL HIGH (ref 0.450–4.500)

## 2014-10-28 LAB — VITAMIN D 25 HYDROXY (VIT D DEFICIENCY, FRACTURES): Vit D, 25-Hydroxy: 20.5 ng/mL — ABNORMAL LOW (ref 30.0–100.0)

## 2014-10-28 MED ORDER — VITAMIN D (ERGOCALCIFEROL) 1.25 MG (50000 UNIT) PO CAPS: 50000.0000 [IU] | ORAL_CAPSULE | ORAL | Status: DC

## 2014-10-28 MED ORDER — LEVOTHYROXINE SODIUM 50 MCG PO TABS: 50.0000 ug | ORAL_TABLET | Freq: Every day | ORAL | Status: DC

## 2014-10-28 NOTE — Addendum Note (Signed)
Addended by: Thana Ates on: 10/28/2014 11:44 AM   Modules accepted: Orders

## 2015-01-20 ENCOUNTER — Other Ambulatory Visit: Payer: Self-pay | Admitting: Family

## 2015-01-20 NOTE — Telephone Encounter (Signed)
Per last lab note patient is due for labwork. Please advise on refill

## 2015-07-14 ENCOUNTER — Other Ambulatory Visit: Payer: Self-pay | Admitting: Family

## 2015-07-15 NOTE — Telephone Encounter (Signed)
PT needs chronic follow up appt

## 2015-07-15 NOTE — Telephone Encounter (Signed)
Last TSH was 10/2014 and was abnormal

## 2015-07-17 ENCOUNTER — Other Ambulatory Visit: Payer: Self-pay | Admitting: Family

## 2015-10-02 ENCOUNTER — Other Ambulatory Visit: Payer: Self-pay | Admitting: Family

## 2015-10-02 NOTE — Telephone Encounter (Signed)
Last seen 10/27/14  Kendra Fuller  Last Vit D 10/27/14  20.5

## 2015-10-15 ENCOUNTER — Ambulatory Visit (INDEPENDENT_AMBULATORY_CARE_PROVIDER_SITE_OTHER): Payer: BLUE CROSS/BLUE SHIELD | Admitting: Family

## 2015-10-15 ENCOUNTER — Encounter: Payer: Self-pay | Admitting: Family

## 2015-10-15 VITALS — BP 134/84 | HR 95 | Temp 98.1°F | Ht 67.0 in | Wt 158.6 lb

## 2015-10-15 DIAGNOSIS — Z1239 Encounter for other screening for malignant neoplasm of breast: Secondary | ICD-10-CM

## 2015-10-15 DIAGNOSIS — E039 Hypothyroidism, unspecified: Secondary | ICD-10-CM | POA: Diagnosis not present

## 2015-10-15 DIAGNOSIS — Z720 Tobacco use: Secondary | ICD-10-CM | POA: Diagnosis not present

## 2015-10-15 DIAGNOSIS — E785 Hyperlipidemia, unspecified: Secondary | ICD-10-CM

## 2015-10-15 DIAGNOSIS — F329 Major depressive disorder, single episode, unspecified: Secondary | ICD-10-CM | POA: Diagnosis not present

## 2015-10-15 DIAGNOSIS — E559 Vitamin D deficiency, unspecified: Secondary | ICD-10-CM

## 2015-10-15 DIAGNOSIS — Z716 Tobacco abuse counseling: Secondary | ICD-10-CM

## 2015-10-15 DIAGNOSIS — F32A Depression, unspecified: Secondary | ICD-10-CM | POA: Insufficient documentation

## 2015-10-15 MED ORDER — ASPIRIN EC 81 MG PO TBEC: 81.0000 mg | DELAYED_RELEASE_TABLET | Freq: Every day | ORAL | Status: AC

## 2015-10-15 MED ORDER — BUPROPION HCL ER (SR) 150 MG PO TB12: 150.0000 mg | ORAL_TABLET | Freq: Two times a day (BID) | ORAL | Status: DC

## 2015-10-15 NOTE — Patient Instructions (Signed)
Health Maintenance, Female Adopting a healthy lifestyle and getting preventive care can go a long way to promote health and wellness. Talk with your health care provider about what schedule of regular examinations is right for you. This is a good chance for you to check in with your provider about disease prevention and staying healthy. In between checkups, there are plenty of things you can do on your own. Experts have done a lot of research about which lifestyle changes and preventive measures are most likely to keep you healthy. Ask your health care provider for more information. WEIGHT AND DIET  Eat a healthy diet  Be sure to include plenty of vegetables, fruits, low-fat dairy products, and lean protein.  Do not eat a lot of foods high in solid fats, added sugars, or salt.  Get regular exercise. This is one of the most important things you can do for your health.  Most adults should exercise for at least 150 minutes each week. The exercise should increase your heart rate and make you sweat (moderate-intensity exercise).  Most adults should also do strengthening exercises at least twice a week. This is in addition to the moderate-intensity exercise.  Maintain a healthy weight  Body mass index (BMI) is a measurement that can be used to identify possible weight problems. It estimates body fat based on height and weight. Your health care provider can help determine your BMI and help you achieve or maintain a healthy weight.  For females 20 years of age and older:   A BMI below 18.5 is considered underweight.  A BMI of 18.5 to 24.9 is normal.  A BMI of 25 to 29.9 is considered overweight.  A BMI of 30 and above is considered obese.  Watch levels of cholesterol and blood lipids  You should start having your blood tested for lipids and cholesterol at 50 years of age, then have this test every 5 years.  You may need to have your cholesterol levels checked more often if:  Your lipid  or cholesterol levels are high.  You are older than 50 years of age.  You are at high risk for heart disease.  CANCER SCREENING   Lung Cancer  Lung cancer screening is recommended for adults 55-80 years old who are at high risk for lung cancer because of a history of smoking.  A yearly low-dose CT scan of the lungs is recommended for people who:  Currently smoke.  Have quit within the past 15 years.  Have at least a 30-pack-year history of smoking. A pack year is smoking an average of one pack of cigarettes a day for 1 year.  Yearly screening should continue until it has been 15 years since you quit.  Yearly screening should stop if you develop a health problem that would prevent you from having lung cancer treatment.  Breast Cancer  Practice breast self-awareness. This means understanding how your breasts normally appear and feel.  It also means doing regular breast self-exams. Let your health care provider know about any changes, no matter how small.  If you are in your 20s or 30s, you should have a clinical breast exam (CBE) by a health care provider every 1-3 years as part of a regular health exam.  If you are 40 or older, have a CBE every year. Also consider having a breast X-ray (mammogram) every year.  If you have a family history of breast cancer, talk to your health care provider about genetic screening.  If you   are at high risk for breast cancer, talk to your health care provider about having an MRI and a mammogram every year.  Breast cancer gene (BRCA) assessment is recommended for women who have family members with BRCA-related cancers. BRCA-related cancers include:  Breast.  Ovarian.  Tubal.  Peritoneal cancers.  Results of the assessment will determine the need for genetic counseling and BRCA1 and BRCA2 testing. Cervical Cancer Your health care provider may recommend that you be screened regularly for cancer of the pelvic organs (ovaries, uterus, and  vagina). This screening involves a pelvic examination, including checking for microscopic changes to the surface of your cervix (Pap test). You may be encouraged to have this screening done every 3 years, beginning at age 21.  For women ages 30-65, health care providers may recommend pelvic exams and Pap testing every 3 years, or they may recommend the Pap and pelvic exam, combined with testing for human papilloma virus (HPV), every 5 years. Some types of HPV increase your risk of cervical cancer. Testing for HPV may also be done on women of any age with unclear Pap test results.  Other health care providers may not recommend any screening for nonpregnant women who are considered low risk for pelvic cancer and who do not have symptoms. Ask your health care provider if a screening pelvic exam is right for you.  If you have had past treatment for cervical cancer or a condition that could lead to cancer, you need Pap tests and screening for cancer for at least 20 years after your treatment. If Pap tests have been discontinued, your risk factors (such as having a new sexual partner) need to be reassessed to determine if screening should resume. Some women have medical problems that increase the chance of getting cervical cancer. In these cases, your health care provider may recommend more frequent screening and Pap tests. Colorectal Cancer  This type of cancer can be detected and often prevented.  Routine colorectal cancer screening usually begins at 50 years of age and continues through 50 years of age.  Your health care provider may recommend screening at an earlier age if you have risk factors for colon cancer.  Your health care provider may also recommend using home test kits to check for hidden blood in the stool.  A small camera at the end of a tube can be used to examine your colon directly (sigmoidoscopy or colonoscopy). This is done to check for the earliest forms of colorectal  cancer.  Routine screening usually begins at age 50.  Direct examination of the colon should be repeated every 5-10 years through 50 years of age. However, you may need to be screened more often if early forms of precancerous polyps or small growths are found. Skin Cancer  Check your skin from head to toe regularly.  Tell your health care provider about any new moles or changes in moles, especially if there is a change in a mole's shape or color.  Also tell your health care provider if you have a mole that is larger than the size of a pencil eraser.  Always use sunscreen. Apply sunscreen liberally and repeatedly throughout the day.  Protect yourself by wearing long sleeves, pants, a wide-brimmed hat, and sunglasses whenever you are outside. HEART DISEASE, DIABETES, AND HIGH BLOOD PRESSURE   High blood pressure causes heart disease and increases the risk of stroke. High blood pressure is more likely to develop in:  People who have blood pressure in the high end   of the normal range (130-139/85-89 mm Hg).  People who are overweight or obese.  People who are African American.  If you are 38-23 years of age, have your blood pressure checked every 3-5 years. If you are 61 years of age or older, have your blood pressure checked every year. You should have your blood pressure measured twice--once when you are at a hospital or clinic, and once when you are not at a hospital or clinic. Record the average of the two measurements. To check your blood pressure when you are not at a hospital or clinic, you can use:  An automated blood pressure machine at a pharmacy.  A home blood pressure monitor.  If you are between 45 years and 39 years old, ask your health care provider if you should take aspirin to prevent strokes.  Have regular diabetes screenings. This involves taking a blood sample to check your fasting blood sugar level.  If you are at a normal weight and have a low risk for diabetes,  have this test once every three years after 50 years of age.  If you are overweight and have a high risk for diabetes, consider being tested at a younger age or more often. PREVENTING INFECTION  Hepatitis B  If you have a higher risk for hepatitis B, you should be screened for this virus. You are considered at high risk for hepatitis B if:  You were born in a country where hepatitis B is common. Ask your health care provider which countries are considered high risk.  Your parents were born in a high-risk country, and you have not been immunized against hepatitis B (hepatitis B vaccine).  You have HIV or AIDS.  You use needles to inject street drugs.  You live with someone who has hepatitis B.  You have had sex with someone who has hepatitis B.  You get hemodialysis treatment.  You take certain medicines for conditions, including cancer, organ transplantation, and autoimmune conditions. Hepatitis C  Blood testing is recommended for:  Everyone born from [birth year] through 1965.  Anyone with known risk factors for hepatitis C. Sexually transmitted infections (STIs)  You should be screened for sexually transmitted infections (STIs) including gonorrhea and chlamydia if:  You are sexually active and are younger than 50 years of age.  You are older than 50 years of age and your health care provider tells you that you are at risk for this type of infection.  Your sexual activity has changed since you were last screened and you are at an increased risk for chlamydia or gonorrhea. Ask your health care provider if you are at risk.  If you do not have HIV, but are at risk, it may be recommended that you take a prescription medicine daily to prevent HIV infection. This is called pre-exposure prophylaxis (PrEP). You are considered at risk if:  You are sexually active and do not regularly use condoms or know the HIV status of your partner(s).  You take drugs by injection.  You are sexually  active with a partner who has HIV. Talk with your health care provider about whether you are at high risk of being infected with HIV. If you choose to begin PrEP, you should first be tested for HIV. You should then be tested every 3 months for as long as you are taking PrEP.  PREGNANCY   If you are premenopausal and you may become pregnant, ask your health care provider about preconception counseling.  If you may  become pregnant, take 400 to 800 micrograms (mcg) of folic acid every day.  If you want to prevent pregnancy, talk to your health care provider about birth control (contraception). OSTEOPOROSIS AND MENOPAUSE   Osteoporosis is a disease in which the bones lose minerals and strength with aging. This can result in serious bone fractures. Your risk for osteoporosis can be identified using a bone density scan.  If you are 61 years of age or older, or if you are at risk for osteoporosis and fractures, ask your health care provider if you should be screened.  Ask your health care provider whether you should take a calcium or vitamin D supplement to lower your risk for osteoporosis.  Menopause may have certain physical symptoms and risks.  Hormone replacement therapy may reduce some of these symptoms and risks. Talk to your health care provider about whether hormone replacement therapy is right for you.  HOME CARE INSTRUCTIONS   Schedule regular health, dental, and eye exams.  Stay current with your immunizations.   Do not use any tobacco products including cigarettes, chewing tobacco, or electronic cigarettes.  If you are pregnant, do not drink alcohol.  If you are breastfeeding, limit how much and how often you drink alcohol.  Limit alcohol intake to no more than 1 drink per day for nonpregnant women. One drink equals 12 ounces of beer, 5 ounces of wine, or 1 ounces of hard liquor.  Do not use street drugs.  Do not share needles.  Ask your health care provider for help if  you need support or information about quitting drugs.  Tell your health care provider if you often feel depressed.  Tell your health care provider if you have ever been abused or do not feel safe at home.   This information is not intended to replace advice given to you by your health care provider. Make sure you discuss any questions you have with your health care provider.   Document Released: 02/07/2011 Document Revised: 08/15/2014 Document Reviewed: 06/26/2013 Elsevier Interactive Patient Education [employer].

## 2015-10-15 NOTE — Progress Notes (Signed)
Subjective:    Patient ID: Kendra Fuller, female    DOB: 03-Aug-1966, 50 y.o.   MRN: [id number]  Pt presents to the office today for chronic follow up.  Thyroid Problem Presents for follow-up visit. Patient reports no anxiety, constipation, depressed mood, dry skin, fatigue, heat intolerance, hoarse voice, palpitations or visual change. The symptoms have been stable. Past treatments include levothyroxine. The treatment provided significant relief. Her past medical history is significant for hyperlipidemia.  Depression        This is a new problem.  The current episode started more than 1 month ago.   The onset quality is gradual.   The problem occurs daily.  Associated symptoms include helplessness, hopelessness, decreased interest and sad.  Associated symptoms include no fatigue, not irritable, no appetite change and no suicidal ideas.     The symptoms are aggravated by family issues.  Past treatments include nothing.  Compliance with treatment is good.  Past medical history includes thyroid problem.   Hyperlipidemia This is a chronic problem. The current episode started more than 1 year ago. The problem is uncontrolled. Recent lipid tests were reviewed and are high. Factors aggravating her hyperlipidemia include smoking. Pertinent negatives include no leg pain or shortness of breath. She is currently on no antihyperlipidemic treatment. The current treatment provides no improvement of lipids. Risk factors for coronary artery disease include family history, dyslipidemia and obesity.      Review of Systems  Constitutional: Negative for appetite change and fatigue.  HENT: Negative for hoarse voice.   Eyes: Negative.   Respiratory: Negative for shortness of breath.   Cardiovascular: Negative.  Negative for palpitations.  Gastrointestinal: Negative.  Negative for constipation.  Endocrine: Negative.  Negative for heat intolerance.  Genitourinary: Negative.   Musculoskeletal: Negative.     Neurological: Negative.   Hematological: Negative.   Psychiatric/Behavioral: Positive for depression. Negative for suicidal ideas. The patient is not nervous/anxious.   All other systems reviewed and are negative.      Objective:   Physical Exam  Constitutional: She is oriented to person, place, and time. She appears well-developed and well-nourished. She is not irritable. No distress.  HENT:  Head: Normocephalic and atraumatic.  Right Ear: External ear normal.  Left Ear: External ear normal.  Nasal passage erythemas with mild swelling  Oropharynx erythemas  Eyes: Pupils are equal, round, and reactive to light.  Neck: Normal range of motion. Neck supple. No thyromegaly present.  Cardiovascular: Normal rate, regular rhythm, normal heart sounds and intact distal pulses.   No murmur heard. Pulmonary/Chest: Effort normal and breath sounds normal. No respiratory distress. She has no wheezes.  Abdominal: Soft. Bowel sounds are normal. She exhibits no distension. There is no tenderness.  Musculoskeletal: Normal range of motion. She exhibits no edema or tenderness.  Neurological: She is alert and oriented to person, place, and time. She has normal reflexes. No cranial nerve deficit.  Skin: Skin is warm and dry.  Psychiatric: She has a normal mood and affect. Her behavior is normal. Judgment and thought content normal.  Vitals reviewed.  BP 134/84 mmHg  Pulse 95  Temp(Src) 98.1 F (36.7 C) (Oral)  Ht '5\' 7"'  (1.702 m)  Wt 158 lb 9.6 oz (71.94 kg)  BMI 24.83 kg/m2  LMP 10/07/2014       Assessment & Plan:  1. Hypothyroidism, unspecified hypothyroidism type - CMP14+EGFR - Thyroid Panel With TSH  2. Vitamin D deficiency - CMP14+EGFR - VITAMIN D 25 Hydroxy (Vit-D Deficiency,  Fractures)  3. Tobacco abuse - CMP14+EGFR - buPROPion (WELLBUTRIN SR) 150 MG 12 hr tablet; Take 1 tablet (150 mg total) by mouth 2 (two) times daily.  Dispense: 180 tablet; Refill: 1  4. Breast cancer  screening - [facility] - CMP14+EGFR  5. Hyperlipidemia - CMP14+EGFR - Lipid panel  6. Depression -PT started on Wellbutrin 150 mg BID today - buPROPion (WELLBUTRIN SR) 150 MG 12 hr tablet; Take 1 tablet (150 mg total) by mouth 2 (two) times daily.  Dispense: 180 tablet; Refill: 1  7. Encounter for smoking cessation counseling - buPROPion (WELLBUTRIN SR) 150 MG 12 hr tablet; Take 1 tablet (150 mg total) by mouth 2 (two) times daily.  Dispense: 180 tablet; Refill: 1   Continue all meds Labs pending Health Maintenance reviewed Diet and exercise encouraged RTO 1 month to recheck Depression  [name], FNP

## 2015-10-16 ENCOUNTER — Other Ambulatory Visit: Payer: Self-pay | Admitting: Family

## 2015-10-16 LAB — CMP14+EGFR
ALT: 20 IU/L (ref 0–32)
AST: 18 IU/L (ref 0–40)
Albumin/Globulin Ratio: 1.8 (ref 1.1–2.5)
Albumin: 4.3 g/dL (ref 3.5–5.5)
Alkaline Phosphatase: 99 IU/L (ref 39–117)
BUN/Creatinine Ratio: 9 (ref 9–23)
BUN: 7 mg/dL (ref 6–24)
Bilirubin Total: 0.4 mg/dL (ref 0.0–1.2)
CO2: 22 mmol/L (ref 18–29)
Calcium: 9.3 mg/dL (ref 8.7–10.2)
Chloride: 103 mmol/L (ref 96–106)
Creatinine, Ser: 0.76 mg/dL (ref 0.57–1.00)
GFR calc Af Amer: 107 mL/min/{1.73_m2} (ref >59)
GFR calc non Af Amer: 92 mL/min/{1.73_m2} (ref >59)
Globulin, Total: 2.4 g/dL (ref 1.5–4.5)
Glucose: 91 mg/dL (ref 65–99)
Potassium: 4.6 mmol/L (ref 3.5–5.2)
Sodium: 143 mmol/L (ref 134–144)
Total Protein: 6.7 g/dL (ref 6.0–8.5)

## 2015-10-16 LAB — VITAMIN D 25 HYDROXY (VIT D DEFICIENCY, FRACTURES): Vit D, 25-Hydroxy: 44.8 ng/mL (ref 30.0–100.0)

## 2015-10-16 LAB — LIPID PANEL
Chol/HDL Ratio: 6 ratio units — ABNORMAL HIGH (ref 0.0–4.4)
Cholesterol, Total: 251 mg/dL — ABNORMAL HIGH (ref 100–199)
HDL: 42 mg/dL (ref >39)
LDL Calculated: 164 mg/dL — ABNORMAL HIGH (ref 0–99)
Triglycerides: 227 mg/dL — ABNORMAL HIGH (ref 0–149)
VLDL Cholesterol Cal: 45 mg/dL — ABNORMAL HIGH (ref 5–40)

## 2015-10-16 LAB — THYROID PANEL WITH TSH
Free Thyroxine Index: 2.5 (ref 1.2–4.9)
T3 Uptake Ratio: 29 % (ref 24–39)
T4, Total: 8.6 ug/dL (ref 4.5–12.0)
TSH: 2.81 u[IU]/mL (ref 0.450–4.500)

## 2015-10-16 MED ORDER — ATORVASTATIN CALCIUM 20 MG PO TABS: 20.0000 mg | ORAL_TABLET | Freq: Every day | ORAL | Status: DC

## 2015-10-19 ENCOUNTER — Ambulatory Visit: Payer: PRIVATE HEALTH INSURANCE | Admitting: Family

## 2015-10-23 ENCOUNTER — Telehealth: Payer: Self-pay | Admitting: Family

## 2015-10-23 ENCOUNTER — Other Ambulatory Visit: Payer: Self-pay

## 2015-10-23 MED ORDER — LEVOTHYROXINE SODIUM 50 MCG PO TABS: ORAL_TABLET | ORAL | Status: DC

## 2016-07-07 ENCOUNTER — Other Ambulatory Visit: Payer: Self-pay | Admitting: Family

## 2016-07-07 DIAGNOSIS — F329 Major depressive disorder, single episode, unspecified: Secondary | ICD-10-CM

## 2016-07-07 DIAGNOSIS — F32A Depression, unspecified: Secondary | ICD-10-CM

## 2016-07-07 DIAGNOSIS — Z720 Tobacco use: Secondary | ICD-10-CM

## 2016-07-07 DIAGNOSIS — Z716 Tobacco abuse counseling: Secondary | ICD-10-CM

## 2016-07-07 NOTE — Telephone Encounter (Signed)
Patient last seen in March of this year and was return in 4 weeks for recheck of meds. Please advise on RFs

## 2016-10-13 ENCOUNTER — Other Ambulatory Visit: Payer: Self-pay | Admitting: Nurse Practitioner

## 2016-11-13 ENCOUNTER — Other Ambulatory Visit: Payer: Self-pay | Admitting: Family

## 2017-01-11 ENCOUNTER — Encounter: Payer: Self-pay | Admitting: Family

## 2017-01-11 ENCOUNTER — Ambulatory Visit (INDEPENDENT_AMBULATORY_CARE_PROVIDER_SITE_OTHER): Payer: BLUE CROSS/BLUE SHIELD | Admitting: Family

## 2017-01-11 VITALS — BP 139/93 | HR 87 | Temp 98.4°F | Ht 67.0 in | Wt 158.4 lb

## 2017-01-11 DIAGNOSIS — E559 Vitamin D deficiency, unspecified: Secondary | ICD-10-CM

## 2017-01-11 DIAGNOSIS — Z720 Tobacco use: Secondary | ICD-10-CM

## 2017-01-11 DIAGNOSIS — Z1211 Encounter for screening for malignant neoplasm of colon: Secondary | ICD-10-CM

## 2017-01-11 DIAGNOSIS — E039 Hypothyroidism, unspecified: Secondary | ICD-10-CM

## 2017-01-11 DIAGNOSIS — F331 Major depressive disorder, recurrent, moderate: Secondary | ICD-10-CM

## 2017-01-11 DIAGNOSIS — E785 Hyperlipidemia, unspecified: Secondary | ICD-10-CM

## 2017-01-11 NOTE — Patient Instructions (Signed)

## 2017-01-11 NOTE — Progress Notes (Signed)
   Subjective:    Patient ID: Kendra Fuller, female    DOB: [birth year], 51 y.o.   MRN: [id number]  Pt presents to the office today for chronic follow up.  Hyperlipidemia  This is a chronic problem. The current episode started more than 1 year ago. The problem is uncontrolled. Recent lipid tests were reviewed and are high. Exacerbating diseases include obesity. She is currently on no antihyperlipidemic treatment. The current treatment provides no improvement of lipids. Risk factors for coronary artery disease include dyslipidemia, post-menopausal, obesity and family history.  Depression         This is a chronic problem.  The current episode started more than 1 year ago.   The onset quality is gradual.   The problem occurs intermittently.  Associated symptoms include no helplessness, no hopelessness, not irritable, no restlessness and not sad.  Past medical history includes thyroid problem.   Thyroid Problem  Presents for follow-up visit. Patient reports no depressed mood, diarrhea, heat intolerance, hoarse voice or visual change. The symptoms have been stable. Her past medical history is significant for hyperlipidemia.      Review of Systems  HENT: Negative for hoarse voice.   Gastrointestinal: Negative for diarrhea.  Endocrine: Negative for heat intolerance.  Psychiatric/Behavioral: Positive for depression.  All other systems reviewed and are negative.      Objective:   Physical Exam  Constitutional: She is oriented to person, place, and time. She appears well-developed and well-nourished. She is not irritable. No distress.  HENT:  Head: Normocephalic and atraumatic.  Right Ear: External ear normal.  Left Ear: External ear normal.  Nose: Nose normal.  Mouth/Throat: Oropharynx is clear and moist.  Eyes: Pupils are equal, round, and reactive to light.  Neck: Normal range of motion. Neck supple. No thyromegaly present.  Cardiovascular: Normal rate, regular rhythm, normal heart  sounds and intact distal pulses.   No murmur heard. Pulmonary/Chest: Effort normal and breath sounds normal. No respiratory distress. She has no wheezes.  Abdominal: Soft. Bowel sounds are normal. She exhibits no distension. There is no tenderness.  Musculoskeletal: Normal range of motion. She exhibits no edema or tenderness.  Neurological: She is alert and oriented to person, place, and time.  Skin: Skin is warm and dry.  Psychiatric: She has a normal mood and affect. Her behavior is normal. Judgment and thought content normal.  Vitals reviewed.     BP (!) 139/92   Pulse 84   Temp 98.4 F (36.9 C) (Oral)   Ht _0  (1.702 m)   Wt 158 lb 6.4 oz (71.8 kg)   LMP 10/07/2014   BMI 24.81 kg/m      Assessment & Plan:  1. Hypothyroidism, unspecified type - CMP14+EGFR - Thyroid Panel With TSH  2. Vitamin D deficiency - CMP14+EGFR - VITAMIN D 25 Hydroxy (Vit-D Deficiency, Fractures)  3. Tobacco abuse  - CMP14+EGFR  4. Hyperlipidemia, unspecified hyperlipidemia type - CMP14+EGFR - Lipid panel  5. Moderate episode of recurrent major depressive disorder (HCC) - CMP14+EGFR  6. Colon cancer screening - Ambulatory referral to Gastroenterology   Continue all meds Labs pending Health Maintenance reviewed Diet and exercise encouraged RTO 1 year   [name], FNP

## 2017-01-12 ENCOUNTER — Other Ambulatory Visit: Payer: Self-pay | Admitting: Family

## 2017-01-12 LAB — CMP14+EGFR
ALT: 17 IU/L (ref 0–32)
AST: 21 IU/L (ref 0–40)
Albumin/Globulin Ratio: 1.8 (ref 1.2–2.2)
Albumin: 4.4 g/dL (ref 3.5–5.5)
Alkaline Phosphatase: 89 IU/L (ref 39–117)
BUN/Creatinine Ratio: 12 (ref 9–23)
BUN: 8 mg/dL (ref 6–24)
Bilirubin Total: 0.4 mg/dL (ref 0.0–1.2)
CO2: 25 mmol/L (ref 18–29)
Calcium: 9 mg/dL (ref 8.7–10.2)
Chloride: 103 mmol/L (ref 96–106)
Creatinine, Ser: 0.69 mg/dL (ref 0.57–1.00)
GFR calc Af Amer: 117 mL/min/{1.73_m2} (ref >59)
GFR calc non Af Amer: 102 mL/min/{1.73_m2} (ref >59)
Globulin, Total: 2.4 g/dL (ref 1.5–4.5)
Glucose: 78 mg/dL (ref 65–99)
Potassium: 4.5 mmol/L (ref 3.5–5.2)
Sodium: 141 mmol/L (ref 134–144)
Total Protein: 6.8 g/dL (ref 6.0–8.5)

## 2017-01-12 LAB — LIPID PANEL
Chol/HDL Ratio: 5.5 ratio — ABNORMAL HIGH (ref 0.0–4.4)
Cholesterol, Total: 238 mg/dL — ABNORMAL HIGH (ref 100–199)
HDL: 43 mg/dL (ref >39)
LDL Calculated: 142 mg/dL — ABNORMAL HIGH (ref 0–99)
Triglycerides: 263 mg/dL — ABNORMAL HIGH (ref 0–149)
VLDL Cholesterol Cal: 53 mg/dL — ABNORMAL HIGH (ref 5–40)

## 2017-01-12 LAB — VITAMIN D 25 HYDROXY (VIT D DEFICIENCY, FRACTURES): Vit D, 25-Hydroxy: 56.1 ng/mL (ref 30.0–100.0)

## 2017-01-12 LAB — THYROID PANEL WITH TSH
Free Thyroxine Index: 2.3 (ref 1.2–4.9)
T3 Uptake Ratio: 29 % (ref 24–39)
T4, Total: 7.8 ug/dL (ref 4.5–12.0)
TSH: 3.24 u[IU]/mL (ref 0.450–4.500)

## 2017-10-03 ENCOUNTER — Ambulatory Visit (INDEPENDENT_AMBULATORY_CARE_PROVIDER_SITE_OTHER): Payer: BLUE CROSS/BLUE SHIELD | Admitting: Family

## 2017-10-03 ENCOUNTER — Encounter: Payer: Self-pay | Admitting: Family

## 2017-10-03 VITALS — BP 138/84 | HR 93 | Temp 98.6°F | Ht 67.0 in | Wt 168.4 lb

## 2017-10-03 DIAGNOSIS — E039 Hypothyroidism, unspecified: Secondary | ICD-10-CM

## 2017-10-03 DIAGNOSIS — E559 Vitamin D deficiency, unspecified: Secondary | ICD-10-CM

## 2017-10-03 DIAGNOSIS — F331 Major depressive disorder, recurrent, moderate: Secondary | ICD-10-CM

## 2017-10-03 DIAGNOSIS — Z0000 Encounter for general adult medical examination without abnormal findings: Secondary | ICD-10-CM

## 2017-10-03 DIAGNOSIS — F17200 Nicotine dependence, unspecified, uncomplicated: Secondary | ICD-10-CM

## 2017-10-03 DIAGNOSIS — E785 Hyperlipidemia, unspecified: Secondary | ICD-10-CM

## 2017-10-03 DIAGNOSIS — Z720 Tobacco use: Secondary | ICD-10-CM

## 2017-10-03 DIAGNOSIS — J301 Allergic rhinitis due to pollen: Secondary | ICD-10-CM

## 2017-10-03 MED ORDER — FLUTICASONE PROPIONATE 50 MCG/ACT NA SUSP: 2.0000 | Freq: Every day | NASAL | 6 refills | Status: AC

## 2017-10-03 MED ORDER — LEVOTHYROXINE SODIUM 50 MCG PO TABS: ORAL_TABLET | ORAL | 2 refills | Status: DC

## 2017-10-03 NOTE — Patient Instructions (Signed)

## 2017-10-03 NOTE — Progress Notes (Signed)
Subjective:    Patient ID: Kendra Fuller, female    DOB: 1965-10-23, 52 y.o.   MRN: [id number]  PT presents to the office today for CPE without pap.  Thyroid Problem  Presents for follow-up visit. Patient reports no constipation, diaphoresis, diarrhea, dry skin or fatigue. The symptoms have been stable. Her past medical history is significant for hyperlipidemia.  Hyperlipidemia  This is a chronic problem. The current episode started more than 1 year ago. The problem is uncontrolled. Recent lipid tests were reviewed and are high. Exacerbating diseases include obesity. Current antihyperlipidemic treatment includes diet change. The current treatment provides no improvement of lipids. Risk factors for coronary artery disease include dyslipidemia.  Depression         This is a chronic problem.  The current episode started more than 1 year ago.   The onset quality is gradual.   The problem occurs intermittently.  The problem has been waxing and waning since onset.  Associated symptoms include sad.  Associated symptoms include no fatigue, no helplessness and no hopelessness.     The symptoms are aggravated by nothing.  Past treatments include nothing.  Past medical history includes thyroid problem.   Sinus Problem  This is a new problem. The current episode started more than 1 month ago. The problem has been waxing and waning since onset. Pertinent negatives include no diaphoresis.      Review of Systems  Constitutional: Negative for diaphoresis and fatigue.  Gastrointestinal: Negative for constipation and diarrhea.  Psychiatric/Behavioral: Positive for depression.  All other systems reviewed and are negative.      Objective:   Physical Exam  Constitutional: She is oriented to person, place, and time. She appears well-developed and well-nourished. No distress.  HENT:  Head: Normocephalic.  Right Ear: External ear normal.  Left Ear: External ear normal.  Nose: Nose normal.  Mouth/Throat:  Oropharynx is clear and moist.  Eyes: Pupils are equal, round, and reactive to light.  Neck: Normal range of motion. Neck supple. No thyromegaly present.  Cardiovascular: Normal rate, regular rhythm, normal heart sounds and intact distal pulses.  No murmur heard. Pulmonary/Chest: Effort normal and breath sounds normal. No respiratory distress. She has no wheezes.  Abdominal: Soft. Bowel sounds are normal. She exhibits no distension. There is no tenderness.  Musculoskeletal: Normal range of motion. She exhibits no edema or tenderness.  Neurological: She is alert and oriented to person, place, and time.  Skin: Skin is warm and dry.  Psychiatric: She has a normal mood and affect. Her behavior is normal. Judgment and thought content normal.  Vitals reviewed.    BP 138/84   Pulse 93   Temp 98.6 F (37 C) (Oral)   Ht '5\' 7"'  (1.702 m)   Wt 168 lb 6.4 oz (76.4 kg)   LMP 10/07/2014   BMI 26.38 kg/m      Assessment & Plan:  1. Annual physical exam - Anemia Profile B - CMP14+EGFR - Lipid panel - TSH - VITAMIN D 25 Hydroxy (Vit-D Deficiency, Fractures)  2. Hypothyroidism, unspecified type - levothyroxine (SYNTHROID, LEVOTHROID) 50 MCG tablet; TAKE 1 TABLET (50 MCG TOTAL) BY MOUTH DAILY BEFORE BREAKFAST.  Dispense: 90 tablet; Refill: 2 - CMP14+EGFR  3. Tobacco abuse  - CMP14+EGFR  4. Vitamin D deficiency - CMP14+EGFR - VITAMIN D 25 Hydroxy (Vit-D Deficiency, Fractures)  5. Moderate episode of recurrent major depressive disorder (HCC) - CMP14+EGFR  6. Hyperlipidemia, unspecified hyperlipidemia type  - CMP14+EGFR - Lipid panel  7. Current smoker - CMP14+EGFR  8. Allergic rhinitis due to pollen, unspecified seasonality - fluticasone (FLONASE) 50 MCG/ACT nasal spray; Place 2 sprays into both nostrils daily.  Dispense: 16 g; Refill: 6   Continue all meds Labs pending Health Maintenance reviewed Diet and exercise encouraged RTO 6 months   [name], FNP

## 2017-10-04 LAB — CMP14+EGFR
ALT: 20 IU/L (ref 0–32)
AST: 19 IU/L (ref 0–40)
Albumin/Globulin Ratio: 1.9 (ref 1.2–2.2)
Albumin: 4.6 g/dL (ref 3.5–5.5)
Alkaline Phosphatase: 90 IU/L (ref 39–117)
BUN/Creatinine Ratio: 13 (ref 9–23)
BUN: 9 mg/dL (ref 6–24)
Bilirubin Total: 0.2 mg/dL (ref 0.0–1.2)
CO2: 27 mmol/L (ref 20–29)
Calcium: 9.5 mg/dL (ref 8.7–10.2)
Chloride: 99 mmol/L (ref 96–106)
Creatinine, Ser: 0.7 mg/dL (ref 0.57–1.00)
GFR calc Af Amer: 116 mL/min/{1.73_m2} (ref >59)
GFR calc non Af Amer: 101 mL/min/{1.73_m2} (ref >59)
Globulin, Total: 2.4 g/dL (ref 1.5–4.5)
Glucose: 102 mg/dL — ABNORMAL HIGH (ref 65–99)
Potassium: 4.3 mmol/L (ref 3.5–5.2)
Sodium: 140 mmol/L (ref 134–144)
Total Protein: 7 g/dL (ref 6.0–8.5)

## 2017-10-04 LAB — ANEMIA PROFILE B
Basophils Absolute: 0 10*3/uL (ref 0.0–0.2)
Basos: 0 %
EOS (ABSOLUTE): 0.4 10*3/uL (ref 0.0–0.4)
Eos: 4 %
Ferritin: 270 ng/mL — ABNORMAL HIGH (ref 15–150)
Folate: 10.7 ng/mL (ref >3.0)
Hematocrit: 43.1 % (ref 34.0–46.6)
Hemoglobin: 14.7 g/dL (ref 11.1–15.9)
Immature Grans (Abs): 0 10*3/uL (ref 0.0–0.1)
Immature Granulocytes: 0 %
Iron Saturation: 25 % (ref 15–55)
Iron: 62 ug/dL (ref 27–159)
Lymphocytes Absolute: 3.6 10*3/uL — ABNORMAL HIGH (ref 0.7–3.1)
Lymphs: 37 %
MCH: 32.2 pg (ref 26.6–33.0)
MCHC: 34.1 g/dL (ref 31.5–35.7)
MCV: 95 fL (ref 79–97)
Monocytes Absolute: 0.9 10*3/uL (ref 0.1–0.9)
Monocytes: 9 %
Neutrophils Absolute: 5 10*3/uL (ref 1.4–7.0)
Neutrophils: 50 %
Platelets: 226 10*3/uL (ref 150–379)
RBC: 4.56 x10E6/uL (ref 3.77–5.28)
RDW: 12.5 % (ref 12.3–15.4)
Retic Ct Pct: 1.5 % (ref 0.6–2.6)
Total Iron Binding Capacity: 245 ug/dL — ABNORMAL LOW (ref 250–450)
UIBC: 183 ug/dL (ref 131–425)
Vitamin B-12: 432 pg/mL (ref 232–1245)
WBC: 9.8 10*3/uL (ref 3.4–10.8)

## 2017-10-04 LAB — VITAMIN D 25 HYDROXY (VIT D DEFICIENCY, FRACTURES): Vit D, 25-Hydroxy: 63.3 ng/mL (ref 30.0–100.0)

## 2017-10-04 LAB — TSH: TSH: 4.64 u[IU]/mL — ABNORMAL HIGH (ref 0.450–4.500)

## 2017-10-04 LAB — LIPID PANEL
Chol/HDL Ratio: 6.5 ratio — ABNORMAL HIGH (ref 0.0–4.4)
Cholesterol, Total: 285 mg/dL — ABNORMAL HIGH (ref 100–199)
HDL: 44 mg/dL (ref >39)
LDL Calculated: 180 mg/dL — ABNORMAL HIGH (ref 0–99)
Triglycerides: 307 mg/dL — ABNORMAL HIGH (ref 0–149)
VLDL Cholesterol Cal: 61 mg/dL — ABNORMAL HIGH (ref 5–40)

## 2017-10-05 ENCOUNTER — Other Ambulatory Visit: Payer: Self-pay | Admitting: Family

## 2017-10-05 MED ORDER — LEVOTHYROXINE SODIUM 75 MCG PO TABS: 75.0000 ug | ORAL_TABLET | Freq: Every day | ORAL | 11 refills | Status: DC

## 2017-10-05 MED ORDER — ATORVASTATIN CALCIUM 40 MG PO TABS: 40.0000 mg | ORAL_TABLET | Freq: Every day | ORAL | 11 refills | Status: DC

## 2018-09-21 ENCOUNTER — Telehealth: Payer: Self-pay | Admitting: Family

## 2018-09-21 MED ORDER — LEVOTHYROXINE SODIUM 75 MCG PO TABS: 75.0000 ug | ORAL_TABLET | Freq: Every day | ORAL | 0 refills | Status: DC

## 2018-09-21 NOTE — Telephone Encounter (Signed)
NA / no VM refill sent to pharmacy

## 2018-09-26 ENCOUNTER — Other Ambulatory Visit: Payer: Self-pay | Admitting: Family

## 2018-10-09 ENCOUNTER — Encounter: Payer: Self-pay | Admitting: Family

## 2018-10-09 ENCOUNTER — Ambulatory Visit: Payer: BLUE CROSS/BLUE SHIELD | Admitting: Family

## 2018-10-09 VITALS — BP 138/85 | HR 93 | Temp 97.7°F | Ht 67.0 in | Wt 175.0 lb

## 2018-10-09 DIAGNOSIS — F411 Generalized anxiety disorder: Secondary | ICD-10-CM

## 2018-10-09 DIAGNOSIS — Z0001 Encounter for general adult medical examination with abnormal findings: Secondary | ICD-10-CM | POA: Diagnosis not present

## 2018-10-09 DIAGNOSIS — E039 Hypothyroidism, unspecified: Secondary | ICD-10-CM | POA: Diagnosis not present

## 2018-10-09 DIAGNOSIS — E663 Overweight: Secondary | ICD-10-CM

## 2018-10-09 DIAGNOSIS — Z0000 Encounter for general adult medical examination without abnormal findings: Secondary | ICD-10-CM

## 2018-10-09 DIAGNOSIS — Z86010 Personal history of colonic polyps: Secondary | ICD-10-CM

## 2018-10-09 DIAGNOSIS — Z720 Tobacco use: Secondary | ICD-10-CM

## 2018-10-09 DIAGNOSIS — E785 Hyperlipidemia, unspecified: Secondary | ICD-10-CM

## 2018-10-09 DIAGNOSIS — F331 Major depressive disorder, recurrent, moderate: Secondary | ICD-10-CM

## 2018-10-09 DIAGNOSIS — E559 Vitamin D deficiency, unspecified: Secondary | ICD-10-CM

## 2018-10-09 DIAGNOSIS — J0100 Acute maxillary sinusitis, unspecified: Secondary | ICD-10-CM

## 2018-10-09 DIAGNOSIS — E669 Obesity, unspecified: Secondary | ICD-10-CM | POA: Insufficient documentation

## 2018-10-09 MED ORDER — AMOXICILLIN-POT CLAVULANATE 875-125 MG PO TABS: 1.0000 | ORAL_TABLET | Freq: Two times a day (BID) | ORAL | 0 refills | Status: DC

## 2018-10-09 MED ORDER — ESCITALOPRAM OXALATE 10 MG PO TABS: 10.0000 mg | ORAL_TABLET | Freq: Every day | ORAL | 3 refills | Status: DC

## 2018-10-09 NOTE — Progress Notes (Signed)
Subjective:    Patient ID: Kendra Fuller, female    DOB: [birth year], 53 y.o.   MRN: [id number]  Chief Complaint  Patient presents with  . Medical Management of Chronic Issues   PT presents to the office today for CPE without pap. Thyroid Problem  Presents for follow-up visit. Symptoms include depressed mood. Patient reports no anxiety, dry skin or fatigue. The symptoms have been stable. Her past medical history is significant for hyperlipidemia.  Depression         This is a chronic problem.  The current episode started more than 1 year ago.   The onset quality is sudden.   The problem occurs intermittently.  Associated symptoms include restlessness, decreased interest, headaches and sad.  Associated symptoms include no fatigue, no helplessness and no hopelessness.  Past treatments include nothing.  Past medical history includes thyroid problem.   Hyperlipidemia  This is a chronic problem. The current episode started more than 1 year ago. The problem is uncontrolled. Recent lipid tests were reviewed and are high. Exacerbating diseases include obesity. Current antihyperlipidemic treatment includes statins. The current treatment provides moderate improvement of lipids. Risk factors for coronary artery disease include dyslipidemia and a sedentary lifestyle.  Sinusitis  This is a new problem. The current episode started 1 to 4 weeks ago. The problem has been waxing and waning since onset. The pain is moderate. Associated symptoms include congestion, coughing, ear pain, headaches and sinus pressure. Past treatments include oral decongestants. The treatment provided mild relief.      Review of Systems  Constitutional: Negative for fatigue.  HENT: Positive for congestion, ear pain and sinus pressure.   Respiratory: Positive for cough.   Neurological: Positive for headaches.  Psychiatric/Behavioral: Positive for depression. The patient is not nervous/anxious.   All other systems reviewed and  are negative.      Objective:   Physical Exam Vitals signs reviewed.  Constitutional:      General: She is not in acute distress.    Appearance: She is well-developed.  HENT:     Head: Normocephalic and atraumatic.     Right Ear: Tympanic membrane normal.     Left Ear: Tympanic membrane normal.     Nose: Mucosal edema and rhinorrhea present.     Right Sinus: Maxillary sinus tenderness present.     Left Sinus: Maxillary sinus tenderness present.     Mouth/Throat:     Pharynx: Posterior oropharyngeal erythema present.  Eyes:     Pupils: Pupils are equal, round, and reactive to light.  Neck:     Musculoskeletal: Normal range of motion and neck supple.     Thyroid: No thyromegaly.  Cardiovascular:     Rate and Rhythm: Normal rate and regular rhythm.     Heart sounds: Normal heart sounds. No murmur.  Pulmonary:     Effort: Pulmonary effort is normal. No respiratory distress.     Breath sounds: Normal breath sounds. No wheezing.  Abdominal:     General: Bowel sounds are normal. There is no distension.     Palpations: Abdomen is soft.     Tenderness: There is no abdominal tenderness.  Musculoskeletal: Normal range of motion.        General: No tenderness.  Skin:    General: Skin is warm and dry.  Neurological:     Mental Status: She is alert and oriented to person, place, and time.     Cranial Nerves: No cranial nerve deficit.  Deep Tendon Reflexes: Reflexes are normal and symmetric.  Psychiatric:        Behavior: Behavior normal.        Thought Content: Thought content normal.        Judgment: Judgment normal.     BP 138/85   Pulse 93   Temp 97.7 F (36.5 C) (Oral)   Ht '5\' 7"'  (1.702 m)   Wt 175 lb (79.4 kg)   LMP 10/07/2014   BMI 27.41 kg/m      Assessment & Plan:  Kendra Fuller comes in today with chief complaint of Medical Management of Chronic Issues   Diagnosis and orders addressed:  1. Annual physical exam - CMP14+EGFR - CBC with  Differential/Platelet - Lipid panel - TSH  2. Hypothyroidism, unspecified type - CMP14+EGFR - CBC with Differential/Platelet  3. Moderate episode of recurrent major depressive disorder (HCC) Will start Lexapro 10 mg  Stress management discussed - CMP14+EGFR - CBC with Differential/Platelet - escitalopram (LEXAPRO) 10 MG tablet; Take 1 tablet (10 mg total) by mouth daily.  Dispense: 90 tablet; Refill: 3  4. Hyperlipidemia, unspecified hyperlipidemia type - CMP14+EGFR - CBC with Differential/Platelet  5. Vitamin D deficiency - CMP14+EGFR - CBC with Differential/Platelet  6. Personal history of colonic polyps - CMP14+EGFR - CBC with Differential/Platelet  7. Overweight (BMI 25.0-29.9) - CMP14+EGFR - CBC with Differential/Platelet  8. Tobacco abuse - CMP14+EGFR - CBC with Differential/Platelet  9. GAD (generalized anxiety disorder) - CMP14+EGFR - CBC with Differential/Platelet  10. Acute maxillary sinusitis, recurrence not specified - Take meds as prescribed - Use a cool mist humidifier  -Use saline nose sprays frequently -Force fluids -For any cough or congestion  Use plain Mucinex- regular strength or max strength is fine -For fever or aces or pains- take tylenol or ibuprofen. -Throat lozenges if help -Augmentin    Labs pending Health Maintenance reviewed Diet and exercise encouraged  Follow up plan: 6 weeks to recheck depression    [name], FNP

## 2018-10-09 NOTE — Patient Instructions (Signed)

## 2018-10-10 LAB — CMP14+EGFR
ALT: 28 IU/L (ref 0–32)
AST: 20 IU/L (ref 0–40)
Albumin/Globulin Ratio: 2.2 (ref 1.2–2.2)
Albumin: 4.6 g/dL (ref 3.8–4.9)
Alkaline Phosphatase: 97 IU/L (ref 39–117)
BUN/Creatinine Ratio: 16 (ref 9–23)
BUN: 13 mg/dL (ref 6–24)
Bilirubin Total: 0.4 mg/dL (ref 0.0–1.2)
CO2: 22 mmol/L (ref 20–29)
Calcium: 9.4 mg/dL (ref 8.7–10.2)
Chloride: 99 mmol/L (ref 96–106)
Creatinine, Ser: 0.8 mg/dL (ref 0.57–1.00)
GFR calc Af Amer: 98 mL/min/{1.73_m2} (ref >59)
GFR calc non Af Amer: 85 mL/min/{1.73_m2} (ref >59)
Globulin, Total: 2.1 g/dL (ref 1.5–4.5)
Glucose: 90 mg/dL (ref 65–99)
Potassium: 4.6 mmol/L (ref 3.5–5.2)
Sodium: 140 mmol/L (ref 134–144)
Total Protein: 6.7 g/dL (ref 6.0–8.5)

## 2018-10-10 LAB — LIPID PANEL
Chol/HDL Ratio: 3.2 ratio (ref 0.0–4.4)
Cholesterol, Total: 182 mg/dL (ref 100–199)
HDL: 57 mg/dL (ref >39)
LDL Calculated: 89 mg/dL (ref 0–99)
Triglycerides: 179 mg/dL — ABNORMAL HIGH (ref 0–149)
VLDL Cholesterol Cal: 36 mg/dL (ref 5–40)

## 2018-10-10 LAB — CBC WITH DIFFERENTIAL/PLATELET
Basophils Absolute: 0.1 10*3/uL (ref 0.0–0.2)
Basos: 1 %
EOS (ABSOLUTE): 0.3 10*3/uL (ref 0.0–0.4)
Eos: 3 %
Hematocrit: 42.1 % (ref 34.0–46.6)
Hemoglobin: 14.3 g/dL (ref 11.1–15.9)
Immature Grans (Abs): 0 10*3/uL (ref 0.0–0.1)
Immature Granulocytes: 0 %
Lymphocytes Absolute: 3.1 10*3/uL (ref 0.7–3.1)
Lymphs: 32 %
MCH: 31.5 pg (ref 26.6–33.0)
MCHC: 34 g/dL (ref 31.5–35.7)
MCV: 93 fL (ref 79–97)
Monocytes Absolute: 0.8 10*3/uL (ref 0.1–0.9)
Monocytes: 8 %
Neutrophils Absolute: 5.5 10*3/uL (ref 1.4–7.0)
Neutrophils: 56 %
Platelets: 236 10*3/uL (ref 150–450)
RBC: 4.54 x10E6/uL (ref 3.77–5.28)
RDW: 11.9 % (ref 11.7–15.4)
WBC: 9.7 10*3/uL (ref 3.4–10.8)

## 2018-10-10 LAB — TSH: TSH: 3.79 u[IU]/mL (ref 0.450–4.500)

## 2018-10-11 ENCOUNTER — Other Ambulatory Visit: Payer: Self-pay | Admitting: Family

## 2018-10-11 MED ORDER — ATORVASTATIN CALCIUM 20 MG PO TABS: 20.0000 mg | ORAL_TABLET | Freq: Every day | ORAL | 3 refills | Status: DC

## 2018-10-27 ENCOUNTER — Other Ambulatory Visit: Payer: Self-pay | Admitting: Family

## 2018-11-05 ENCOUNTER — Other Ambulatory Visit: Payer: Self-pay | Admitting: Family

## 2018-11-14 ENCOUNTER — Telehealth: Payer: Self-pay | Admitting: Family

## 2018-11-14 DIAGNOSIS — J209 Acute bronchitis, unspecified: Secondary | ICD-10-CM

## 2018-11-14 MED ORDER — ALBUTEROL SULFATE HFA 108 (90 BASE) MCG/ACT IN AERS: 2.0000 | INHALATION_SPRAY | Freq: Four times a day (QID) | RESPIRATORY_TRACT | 2 refills | Status: DC | PRN

## 2018-11-14 NOTE — Telephone Encounter (Signed)
Pharmacy [facility] [place]   Pt is needing a refill on albuterol (PROVENTIL HFA;VENTOLIN HFA) 108 (90 BASE) MCG/ACT inhaler

## 2018-11-14 NOTE — Telephone Encounter (Signed)
Please advise - has not gotten since 2016.

## 2018-11-14 NOTE — Telephone Encounter (Signed)
Prescription sent to pharmacy.

## 2018-12-25 ENCOUNTER — Other Ambulatory Visit: Payer: Self-pay | Admitting: Family

## 2018-12-25 DIAGNOSIS — Z1231 Encounter for screening mammogram for malignant neoplasm of breast: Secondary | ICD-10-CM

## 2019-01-02 ENCOUNTER — Other Ambulatory Visit: Payer: Self-pay | Admitting: Family

## 2019-01-02 DIAGNOSIS — F331 Major depressive disorder, recurrent, moderate: Secondary | ICD-10-CM

## 2019-01-02 MED ORDER — ESCITALOPRAM OXALATE 10 MG PO TABS: 10.0000 mg | ORAL_TABLET | Freq: Every day | ORAL | 2 refills | Status: DC

## 2019-01-02 MED ORDER — ATORVASTATIN CALCIUM 20 MG PO TABS: 20.0000 mg | ORAL_TABLET | Freq: Every day | ORAL | 0 refills | Status: DC

## 2019-01-02 MED ORDER — LEVOTHYROXINE SODIUM 75 MCG PO TABS: 75.0000 ug | ORAL_TABLET | Freq: Every day | ORAL | 2 refills | Status: DC

## 2019-01-02 NOTE — Telephone Encounter (Signed)
Pt aware refills sent to [facility]

## 2019-02-14 ENCOUNTER — Other Ambulatory Visit: Payer: Self-pay

## 2019-02-14 ENCOUNTER — Ambulatory Visit
Admission: RE | Admit: 2019-02-14 | Discharge: 2019-02-14 | Disposition: A | Discharge status: Home / Self Care | Payer: BC Managed Care – PPO | Source: Ambulatory Visit | Attending: Family | Admitting: Family

## 2019-02-14 DIAGNOSIS — Z1231 Encounter for screening mammogram for malignant neoplasm of breast: Secondary | ICD-10-CM

## 2019-02-15 ENCOUNTER — Other Ambulatory Visit: Payer: Self-pay | Admitting: Family

## 2019-02-15 DIAGNOSIS — R928 Other abnormal and inconclusive findings on diagnostic imaging of breast: Secondary | ICD-10-CM

## 2019-02-20 ENCOUNTER — Ambulatory Visit
Admission: RE | Admit: 2019-02-20 | Discharge: 2019-02-20 | Disposition: A | Discharge status: Home / Self Care | Payer: BC Managed Care – PPO | Source: Ambulatory Visit | Attending: Family | Admitting: Family

## 2019-02-20 ENCOUNTER — Other Ambulatory Visit: Payer: Self-pay

## 2019-02-20 ENCOUNTER — Other Ambulatory Visit: Payer: Self-pay | Admitting: Family

## 2019-02-20 DIAGNOSIS — N6310 Unspecified lump in the right breast, unspecified quadrant: Secondary | ICD-10-CM

## 2019-02-20 DIAGNOSIS — R928 Other abnormal and inconclusive findings on diagnostic imaging of breast: Secondary | ICD-10-CM

## 2019-02-20 DIAGNOSIS — N6320 Unspecified lump in the left breast, unspecified quadrant: Secondary | ICD-10-CM

## 2019-04-08 ENCOUNTER — Other Ambulatory Visit: Payer: Self-pay | Admitting: Family

## 2019-07-07 ENCOUNTER — Other Ambulatory Visit: Payer: Self-pay | Admitting: Family

## 2019-08-27 ENCOUNTER — Ambulatory Visit
Admission: RE | Admit: 2019-08-27 | Discharge: 2019-08-27 | Disposition: A | Discharge status: Home / Self Care | Payer: BC Managed Care – PPO | Source: Ambulatory Visit | Attending: Family | Admitting: Family

## 2019-08-27 ENCOUNTER — Other Ambulatory Visit: Payer: Self-pay | Admitting: Family

## 2019-08-27 ENCOUNTER — Other Ambulatory Visit: Payer: Self-pay

## 2019-08-27 DIAGNOSIS — N6320 Unspecified lump in the left breast, unspecified quadrant: Secondary | ICD-10-CM

## 2019-08-27 DIAGNOSIS — N6310 Unspecified lump in the right breast, unspecified quadrant: Secondary | ICD-10-CM

## 2019-09-25 ENCOUNTER — Other Ambulatory Visit: Payer: Self-pay

## 2019-09-25 ENCOUNTER — Telehealth: Payer: Self-pay | Admitting: Family

## 2019-09-25 DIAGNOSIS — F331 Major depressive disorder, recurrent, moderate: Secondary | ICD-10-CM

## 2019-09-25 MED ORDER — ESCITALOPRAM OXALATE 10 MG PO TABS: 10.0000 mg | ORAL_TABLET | Freq: Every day | ORAL | 1 refills | Status: DC

## 2019-09-25 NOTE — Telephone Encounter (Signed)
Refilled Escitalopram 10mg  #30 with 1 [facility] [place]. Sent pt Mychart message to pt to make her aware to keep appt with [facility], [facility], [facility].

## 2019-09-25 NOTE — Telephone Encounter (Signed)
Go ahead and prescribe 130-day refill for this patient and have her keep the visit with [facility].

## 2019-09-25 NOTE — Telephone Encounter (Signed)
What is the name of the medication? Escitalopram  Have you contacted your pharmacy to request a refill?  Yes  Which pharmacy would you like this sent to?  [facility] [place] [facility]MORE  Pt says she will run out of this medicine this Friday. Pt did schedule her annual check up with Christy on 10/01/19.   Patient notified that their request is being sent to the clinical staff for review and that they should receive a call once it is complete. If they do not receive a call within 24 hours they can check with their pharmacy or our office.

## 2019-09-25 NOTE — Telephone Encounter (Signed)
[facility] patient....  Patient's last office visit 10/09/18.  Was supposed to follow up in 6 weeks for depression.

## 2019-09-26 MED ORDER — ESCITALOPRAM OXALATE 10 MG PO TABS: 10.0000 mg | ORAL_TABLET | Freq: Every day | ORAL | 1 refills | Status: DC

## 2019-09-26 NOTE — Telephone Encounter (Signed)
Refill failed. resent °

## 2019-09-26 NOTE — Addendum Note (Signed)
Addended by: Antonietta Barcelona D on: 09/26/2019 01:40 PM   Modules accepted: Orders

## 2019-10-01 ENCOUNTER — Ambulatory Visit (INDEPENDENT_AMBULATORY_CARE_PROVIDER_SITE_OTHER): Payer: Commercial Managed Care - PPO | Admitting: Family

## 2019-10-01 ENCOUNTER — Encounter: Payer: Self-pay | Admitting: Family

## 2019-10-01 ENCOUNTER — Other Ambulatory Visit: Payer: Self-pay

## 2019-10-01 VITALS — BP 142/87 | HR 82 | Temp 97.5°F | Ht 67.0 in | Wt 184.6 lb

## 2019-10-01 DIAGNOSIS — E039 Hypothyroidism, unspecified: Secondary | ICD-10-CM

## 2019-10-01 DIAGNOSIS — F331 Major depressive disorder, recurrent, moderate: Secondary | ICD-10-CM | POA: Diagnosis not present

## 2019-10-01 DIAGNOSIS — J449 Chronic obstructive pulmonary disease, unspecified: Secondary | ICD-10-CM

## 2019-10-01 DIAGNOSIS — Z720 Tobacco use: Secondary | ICD-10-CM

## 2019-10-01 DIAGNOSIS — E663 Overweight: Secondary | ICD-10-CM

## 2019-10-01 DIAGNOSIS — E559 Vitamin D deficiency, unspecified: Secondary | ICD-10-CM

## 2019-10-01 DIAGNOSIS — Z0001 Encounter for general adult medical examination with abnormal findings: Secondary | ICD-10-CM

## 2019-10-01 DIAGNOSIS — R030 Elevated blood-pressure reading, without diagnosis of hypertension: Secondary | ICD-10-CM

## 2019-10-01 DIAGNOSIS — E785 Hyperlipidemia, unspecified: Secondary | ICD-10-CM

## 2019-10-01 MED ORDER — ESCITALOPRAM OXALATE 20 MG PO TABS: 20.0000 mg | ORAL_TABLET | Freq: Every day | ORAL | 2 refills | Status: DC

## 2019-10-01 MED ORDER — ALBUTEROL SULFATE HFA 108 (90 BASE) MCG/ACT IN AERS: 2.0000 | INHALATION_SPRAY | Freq: Four times a day (QID) | RESPIRATORY_TRACT | 2 refills | Status: DC | PRN

## 2019-10-01 MED ORDER — LEVOTHYROXINE SODIUM 75 MCG PO TABS: 75.0000 ug | ORAL_TABLET | Freq: Every day | ORAL | 2 refills | Status: DC

## 2019-10-01 NOTE — Progress Notes (Signed)
Subjective:    Patient ID: Kendra Fuller, female    DOB: June 10, 1966, 54 y.o.   MRN: [id number]  Chief Complaint  Patient presents with  . Annual Exam    No pap. Right knee popping and swelling . Taken aleve with no relief. No pain. Patient would like to up lexapro to 20 mg.   Pt presents to the office today for CPE without pap. She is followed by GYN. Her BP is elevated today, she has gained 9 lb over the last year.  Thyroid Problem Presents for follow-up visit. Symptoms include depressed mood. Patient reports no constipation, dry skin or fatigue. The symptoms have been stable. Her past medical history is significant for hyperlipidemia.  Depression        This is a chronic problem.  The current episode started more than 1 year ago.   The onset quality is gradual.   The problem occurs intermittently.  Associated symptoms include irritable, restlessness, decreased interest and sad.  Associated symptoms include no fatigue.  Past treatments include SSRIs - Selective serotonin reuptake inhibitors.  Past medical history includes thyroid problem.   Hyperlipidemia This is a chronic problem. The current episode started more than 1 year ago. The problem is controlled. Recent lipid tests were reviewed and are normal. Exacerbating diseases include obesity. Current antihyperlipidemic treatment includes statins. The current treatment provides moderate improvement of lipids. Risk factors for coronary artery disease include dyslipidemia, hypertension, post-menopausal and a sedentary lifestyle.  Nicotine Dependence Presents for follow-up visit. Symptoms are negative for fatigue. Her urge triggers include company of smokers. The symptoms have been stable. She smokes 1 pack of cigarettes per day.  Knee Pain  The incident occurred more than 1 week ago. There was no injury mechanism. The pain is present in the right knee. The pain is at a severity of 1/10. The pain is moderate. The pain has been intermittent since  onset. Pertinent negatives include no numbness or tingling. She reports no foreign bodies present. The symptoms are aggravated by weight bearing. She has tried NSAIDs, rest and non-weight bearing for the symptoms. The treatment provided mild relief.      Review of Systems  Constitutional: Negative for fatigue.  Gastrointestinal: Negative for constipation.  Neurological: Negative for tingling and numbness.  Psychiatric/Behavioral: Positive for depression.  All other systems reviewed and are negative.  Family History  Problem Relation Age of Onset  . Cancer Mother        lung cancer  . Heart disease Father    Social History   Socioeconomic History  . Marital status: Single    Spouse name: Not on file  . Number of children: Not on file  . Years of education: Not on file  . Highest education level: Not on file  Occupational History  . Not on file  Tobacco Use  . Smoking status: Current Every Day Smoker    Packs/day: 1.00    Years: 12.00    Pack years: 12.00    Types: Cigarettes  . Smokeless tobacco: Never Used  Substance and Sexual Activity  . Alcohol use: Yes    Comment: occ  . Drug use: No  . Sexual activity: Not on file  Other Topics Concern  . Not on file  Social History Narrative  . Not on file   Social Determinants of Health   Financial Resource Strain:   . Difficulty of Paying Living Expenses: Not on file  Food Insecurity:   . Worried About Estate manager/land agent  of Food in the Last Year: Not on file  . Ran Out of Food in the Last Year: Not on file  Transportation Needs:   . Lack of Transportation (Medical): Not on file  . Lack of Transportation (Non-Medical): Not on file  Physical Activity:   . Days of Exercise per Week: Not on file  . Minutes of Exercise per Session: Not on file  Stress:   . Feeling of Stress : Not on file  Social Connections:   . Frequency of Communication with Friends and Family: Not on file  . Frequency of Social Gatherings with Friends and  Family: Not on file  . Attends Religious Services: Not on file  . Active Member of Clubs or Organizations: Not on file  . Attends Archivist Meetings: Not on file  . Marital Status: Not on file       Objective:   Physical Exam Vitals reviewed.  Constitutional:      General: She is irritable. She is not in acute distress.    Appearance: She is well-developed.  HENT:     Head: Normocephalic and atraumatic.     Right Ear: Tympanic membrane normal.     Left Ear: Tympanic membrane normal.  Eyes:     Pupils: Pupils are equal, round, and reactive to light.  Neck:     Thyroid: No thyromegaly.  Cardiovascular:     Rate and Rhythm: Normal rate and regular rhythm.     Heart sounds: Normal heart sounds. No murmur.  Pulmonary:     Effort: Pulmonary effort is normal. No respiratory distress.     Breath sounds: Normal breath sounds. No wheezing.  Abdominal:     General: Bowel sounds are normal. There is no distension.     Palpations: Abdomen is soft.     Tenderness: There is no abdominal tenderness.  Musculoskeletal:        General: No tenderness. Normal range of motion.     Cervical back: Normal range of motion and neck supple.  Skin:    General: Skin is warm and dry.  Neurological:     Mental Status: She is alert and oriented to person, place, and time.     Cranial Nerves: No cranial nerve deficit.     Deep Tendon Reflexes: Reflexes are normal and symmetric.  Psychiatric:        Behavior: Behavior normal.        Thought Content: Thought content normal.        Judgment: Judgment normal.     BP (!) 142/87   Pulse 82   Temp (!) 97.5 F (36.4 C) (Temporal)   Ht '5\' 7"'  (1.702 m)   Wt 184 lb 9.6 oz (83.7 kg)   LMP 10/07/2014   SpO2 95%   BMI 28.91 kg/m        Assessment & Plan:  Kendra Fuller comes in today with chief complaint of Annual Exam (No pap. Right knee popping and swelling . Taken aleve with no relief. No pain. Patient would like to up lexapro to 20  mg.)   Diagnosis and orders addressed:  1. Hypothyroidism, unspecified type - levothyroxine (SYNTHROID) 75 MCG tablet; Take 1 tablet (75 mcg total) by mouth daily.  Dispense: 90 tablet; Refill: 2 - CMP14+EGFR - CBC with Differential/Platelet - TSH  2. Moderate episode of recurrent major depressive disorder (HCC) - escitalopram (LEXAPRO) 20 MG tablet; Take 1 tablet (20 mg total) by mouth daily.  Dispense: 90 tablet; Refill: 2 -  CMP14+EGFR - CBC with Differential/Platelet  3. Vitamin D deficiency - CMP14+EGFR - CBC with Differential/Platelet - VITAMIN D 25 Hydroxy (Vit-D Deficiency, Fractures)  4. Tobacco abuse - CMP14+EGFR - CBC with Differential/Platelet  5. Overweight (BMI 25.0-29.9) - CMP14+EGFR - CBC with Differential/Platelet  6. Hyperlipidemia, unspecified hyperlipidemia type  - CMP14+EGFR - CBC with Differential/Platelet - Lipid panel  7. Chronic obstructive pulmonary disease, unspecified COPD type (HCC) - CMP14+EGFR - CBC with Differential/Platelet - albuterol (VENTOLIN HFA) 108 (90 Base) MCG/ACT inhaler; Inhale 2 puffs into the lungs every 6 (six) hours as needed for wheezing or shortness of breath.  Dispense: 8 g; Refill: 2   8. Elevated blood pressure reading Pt will check her BP at home if >140/90 will call office and we will start BP medication    Labs pending Health Maintenance reviewed Diet and exercise encouraged  Follow up plan: 6 months    [name], FNP

## 2019-10-01 NOTE — Patient Instructions (Signed)
Health Maintenance, Female Adopting a healthy lifestyle and getting preventive care are important in promoting health and wellness. Ask your health care provider about:  The right schedule for you to have regular tests and exams.  Things you can do on your own to prevent diseases and keep yourself healthy. What should I know about diet, weight, and exercise? Eat a healthy diet   Eat a diet that includes plenty of vegetables, fruits, low-fat dairy products, and lean protein.  Do not eat a lot of foods that are high in solid fats, added sugars, or sodium. Maintain a healthy weight Body mass index (BMI) is used to identify weight problems. It estimates body fat based on height and weight. Your health care provider can help determine your BMI and help you achieve or maintain a healthy weight. Get regular exercise Get regular exercise. This is one of the most important things you can do for your health. Most adults should:  Exercise for at least 150 minutes each week. The exercise should increase your heart rate and make you sweat (moderate-intensity exercise).  Do strengthening exercises at least twice a week. This is in addition to the moderate-intensity exercise.  Spend less time sitting. Even light physical activity can be beneficial. Watch cholesterol and blood lipids Have your blood tested for lipids and cholesterol at 54 years of age, then have this test every 5 years. Have your cholesterol levels checked more often if:  Your lipid or cholesterol levels are high.  You are older than 54 years of age.  You are at high risk for heart disease. What should I know about cancer screening? Depending on your health history and family history, you may need to have cancer screening at various ages. This may include screening for:  Breast cancer.  Cervical cancer.  Colorectal cancer.  Skin cancer.  Lung cancer. What should I know about heart disease, diabetes, and high blood  pressure? Blood pressure and heart disease  High blood pressure causes heart disease and increases the risk of stroke. This is more likely to develop in people who have high blood pressure readings, are of African descent, or are overweight.  Have your blood pressure checked: ? Every 3-5 years if you are 18-39 years of age. ? Every year if you are 40 years old or older. Diabetes Have regular diabetes screenings. This checks your fasting blood sugar level. Have the screening done:  Once every three years after age 40 if you are at a normal weight and have a low risk for diabetes.  More often and at a younger age if you are overweight or have a high risk for diabetes. What should I know about preventing infection? Hepatitis B If you have a higher risk for hepatitis B, you should be screened for this virus. Talk with your health care provider to find out if you are at risk for hepatitis B infection. Hepatitis C Testing is recommended for:  Everyone born from 1945 through 1965.  Anyone with known risk factors for hepatitis C. Sexually transmitted infections (STIs)  Get screened for STIs, including gonorrhea and chlamydia, if: ? You are sexually active and are younger than 54 years of age. ? You are older than 54 years of age and your health care provider tells you that you are at risk for this type of infection. ? Your sexual activity has changed since you were last screened, and you are at increased risk for chlamydia or gonorrhea. Ask your health care provider if   you are at risk.  Ask your health care provider about whether you are at high risk for HIV. Your health care provider may recommend a prescription medicine to help prevent HIV infection. If you choose to take medicine to prevent HIV, you should first get tested for HIV. You should then be tested every 3 months for as long as you are taking the medicine. Pregnancy  If you are about to stop having your period (premenopausal) and  you may become pregnant, seek counseling before you get pregnant.  Take 400 to 800 micrograms (mcg) of folic acid every day if you become pregnant.  Ask for birth control (contraception) if you want to prevent pregnancy. Osteoporosis and menopause Osteoporosis is a disease in which the bones lose minerals and strength with aging. This can result in bone fractures. If you are 65 years old or older, or if you are at risk for osteoporosis and fractures, ask your health care provider if you should:  Be screened for bone loss.  Take a calcium or vitamin D supplement to lower your risk of fractures.  Be given hormone replacement therapy (HRT) to treat symptoms of menopause. Follow these instructions at home: Lifestyle  Do not use any products that contain nicotine or tobacco, such as cigarettes, e-cigarettes, and chewing tobacco. If you need help quitting, ask your health care provider.  Do not use street drugs.  Do not share needles.  Ask your health care provider for help if you need support or information about quitting drugs. Alcohol use  Do not drink alcohol if: ? Your health care provider tells you not to drink. ? You are pregnant, may be pregnant, or are planning to become pregnant.  If you drink alcohol: ? Limit how much you use to 0-1 drink a day. ? Limit intake if you are breastfeeding.  Be aware of how much alcohol is in your drink. In the [place] one drink equals one 12 oz bottle of beer (355 mL), one 5 oz glass of wine (148 mL), or one 1 oz glass of hard liquor (44 mL). General instructions  Schedule regular health, dental, and eye exams.  Stay current with your vaccines.  Tell your health care provider if: ? You often feel depressed. ? You have ever been abused or do not feel safe at home. Summary  Adopting a healthy lifestyle and getting preventive care are important in promoting health and wellness.  Follow your health care provider's instructions about healthy  diet, exercising, and getting tested or screened for diseases.  Follow your health care provider's instructions on monitoring your cholesterol and blood pressure. This information is not intended to replace advice given to you by your health care provider. Make sure you discuss any questions you have with your health care provider. Document Revised: 07/18/2018 Document Reviewed: 07/18/2018 Elsevier Patient Education  2020 [employer]  

## 2019-10-02 LAB — VITAMIN D 25 HYDROXY (VIT D DEFICIENCY, FRACTURES): Vit D, 25-Hydroxy: 43.7 ng/mL (ref 30.0–100.0)

## 2019-10-02 LAB — LIPID PANEL
Chol/HDL Ratio: 3.7 ratio (ref 0.0–4.4)
Cholesterol, Total: 190 mg/dL (ref 100–199)
HDL: 52 mg/dL (ref >39)
LDL Chol Calc (NIH): 104 mg/dL — ABNORMAL HIGH (ref 0–99)
Triglycerides: 197 mg/dL — ABNORMAL HIGH (ref 0–149)
VLDL Cholesterol Cal: 34 mg/dL (ref 5–40)

## 2019-10-02 LAB — CBC WITH DIFFERENTIAL/PLATELET
Basophils Absolute: 0.1 10*3/uL (ref 0.0–0.2)
Basos: 1 %
EOS (ABSOLUTE): 0.3 10*3/uL (ref 0.0–0.4)
Eos: 3 %
Hematocrit: 42.7 % (ref 34.0–46.6)
Hemoglobin: 14.8 g/dL (ref 11.1–15.9)
Immature Grans (Abs): 0 10*3/uL (ref 0.0–0.1)
Immature Granulocytes: 0 %
Lymphocytes Absolute: 2.9 10*3/uL (ref 0.7–3.1)
Lymphs: 33 %
MCH: 33 pg (ref 26.6–33.0)
MCHC: 34.7 g/dL (ref 31.5–35.7)
MCV: 95 fL (ref 79–97)
Monocytes Absolute: 0.8 10*3/uL (ref 0.1–0.9)
Monocytes: 9 %
Neutrophils Absolute: 4.9 10*3/uL (ref 1.4–7.0)
Neutrophils: 54 %
Platelets: 235 10*3/uL (ref 150–450)
RBC: 4.49 x10E6/uL (ref 3.77–5.28)
RDW: 11.9 % (ref 11.7–15.4)
WBC: 9 10*3/uL (ref 3.4–10.8)

## 2019-10-02 LAB — CMP14+EGFR
ALT: 25 IU/L (ref 0–32)
AST: 22 IU/L (ref 0–40)
Albumin/Globulin Ratio: 2.1 (ref 1.2–2.2)
Albumin: 4.4 g/dL (ref 3.8–4.9)
Alkaline Phosphatase: 86 IU/L (ref 39–117)
BUN/Creatinine Ratio: 13 (ref 9–23)
BUN: 10 mg/dL (ref 6–24)
Bilirubin Total: 0.3 mg/dL (ref 0.0–1.2)
CO2: 22 mmol/L (ref 20–29)
Calcium: 9.1 mg/dL (ref 8.7–10.2)
Chloride: 102 mmol/L (ref 96–106)
Creatinine, Ser: 0.78 mg/dL (ref 0.57–1.00)
GFR calc Af Amer: 100 mL/min/{1.73_m2} (ref >59)
GFR calc non Af Amer: 87 mL/min/{1.73_m2} (ref >59)
Globulin, Total: 2.1 g/dL (ref 1.5–4.5)
Glucose: 93 mg/dL (ref 65–99)
Potassium: 4.4 mmol/L (ref 3.5–5.2)
Sodium: 140 mmol/L (ref 134–144)
Total Protein: 6.5 g/dL (ref 6.0–8.5)

## 2019-10-02 LAB — TSH: TSH: 5.36 u[IU]/mL — ABNORMAL HIGH (ref 0.450–4.500)

## 2019-10-03 ENCOUNTER — Other Ambulatory Visit: Payer: Self-pay | Admitting: Family

## 2019-10-03 MED ORDER — LEVOTHYROXINE SODIUM 88 MCG PO TABS: 88.0000 ug | ORAL_TABLET | Freq: Every day | ORAL | 1 refills | Status: DC

## 2019-10-24 ENCOUNTER — Other Ambulatory Visit: Payer: Self-pay | Admitting: Family

## 2019-10-29 ENCOUNTER — Ambulatory Visit (INDEPENDENT_AMBULATORY_CARE_PROVIDER_SITE_OTHER): Payer: Commercial Managed Care - PPO | Admitting: Family Medicine

## 2019-10-29 DIAGNOSIS — R197 Diarrhea, unspecified: Secondary | ICD-10-CM

## 2019-10-29 NOTE — Progress Notes (Signed)
Telephone visit  Subjective: CC: diarrhea PCP: Kendra Balloon, FNP [id number] Kendra Fuller is a 54 y.o. female calls for telephone consult today. Patient provides verbal consent for consult held via phone.  Due to COVID-19 pandemic this visit was conducted virtually. This visit type was conducted due to national recommendations for restrictions regarding the COVID-19 Pandemic (e.g. social distancing, sheltering in place) in an effort to limit this patient's exposure and mitigate transmission in our community. All issues noted in this document were discussed and addressed.  A physical exam was not performed with this format.   Location of patient: home Location of provider: [facility] Others present for call: none  1. Diarrhea Patient reports onset of noncramping nonbloody diarrhea since the first week of March.  This occurs immediately after eating.  No nausea, vomiting.  She sees Kendra Fuller for a colonic mass.  She has h/o colectomy.  She saw him may 2 years ago and was released for 5 years.  Denies any biotin use.  She is compliant with Synthroid at increased dose of 88 mcg daily.  Diarrhea seemed to onset after the increase in Synthroid.  She is not had chronic diarrhea before.  Sometimes her stomach will get upset with spicy foods but this is rare.  She did eat a yogurt that was 2 days out of date but this is only thing that she can think of that may have caused her symptoms from a consumption standpoint.  No other family members with similar symptoms.   ROS: Per HPI  Allergies  Allergen Reactions  . Oxycodone     Hallucinations   Past Medical History:  Diagnosis Date  . Congestion of upper airway    sinus and chest 1 week, fever one nite  . GERD (gastroesophageal reflux disease)    occ  . Personal history of colonic polyps 10/21/2013   Laparoscopic sigmoid colectomy July 2012  Collected Date: 03/08/2011 Received Date: 03/09/2011 Physician: Kendra Edelman D. 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 - SURGICAL RESECTION MARGINS, NEGATIVE FOR DYSPLASIA OR MALIGNANCY. - SIXTEEN LYMPH NODES, NEGATIVE FOR TUMOR (0/16). - ENDOSCOPIC TATTOO PRESENT Kendra RUND DO Pathologist, Electronic Signature (Case signed 03/10/2011) Specimen Gross and Clinical Information Specimen(s) Obtained: Colon, segmental resection for tumor, splenic flexor with polyp   . Rectal bleeding   . Thyroid disease    hypothyroidism    Current Outpatient Medications:  .  albuterol (VENTOLIN HFA) 108 (90 Base) MCG/ACT inhaler, Inhale 2 puffs into the lungs every 6 (six) hours as needed for wheezing or shortness of breath., Disp: 8 g, Rfl: 2 .  aspirin EC 81 MG tablet, Take 1 tablet (81 mg total) by mouth daily., Disp: 90 tablet, Rfl: 1 .  atorvastatin (LIPITOR) 20 MG tablet, TAKE 1 TABLET BY MOUTH EVERY DAY, Disp: 30 tablet, Rfl: 3 .  Cholecalciferol (VITAMIN D) 2000 units CAPS, Take by mouth., Disp: , Rfl:  .  escitalopram (LEXAPRO) 20 MG tablet, Take 1 tablet (20 mg total) by mouth daily., Disp: 90 tablet, Rfl: 2 .  fluticasone (FLONASE) 50 MCG/ACT nasal spray, Place 2 sprays into both nostrils daily., Disp: 16 g, Rfl: 6 .  Krill Oil 500 MG CAPS, Take by mouth., Disp: , Rfl:  .  levothyroxine (SYNTHROID) 88 MCG tablet, Take 1 tablet (  88 mcg total) by mouth daily., Disp: 90 tablet, Rfl: 1 .  Magnesium Hydroxide (MAGNESIA PO), Take by mouth., Disp: , Rfl:  .  OVER THE COUNTER MEDICATION, CQ 10 daily, Disp: , Rfl:   Assessment/ Plan: 54 y.o. female   1. Diarrhea, unspecified type Highly suspicious that this is to do with the increase in thyroid medication.  Going to repeat her thyroid panel.  She will come in tomorrow to have this done.  I am going to go ahead and collect a stool panel just for  completion.  Though again I do not think this is infectious in etiology is more likely medication induced.  Push hydration.  Probiotic would likely be beneficial while her diarrhea is ongoing.  We will plan to adjust the medication pending the thyroid results. - Thyroid Panel With TSH; Future - Cdiff NAA+O+P+Stool Culture; Future - Basic Metabolic Panel; Future   Start time: 9:43am End time: 9:51am  Total time spent on patient care (including telephone call/ virtual visit): 16 minutes  [place], [place] [phone]

## 2019-10-30 ENCOUNTER — Other Ambulatory Visit: Payer: Self-pay

## 2019-10-30 ENCOUNTER — Other Ambulatory Visit: Payer: Commercial Managed Care - PPO

## 2019-10-30 DIAGNOSIS — R197 Diarrhea, unspecified: Secondary | ICD-10-CM

## 2019-10-31 ENCOUNTER — Other Ambulatory Visit: Payer: Commercial Managed Care - PPO

## 2019-10-31 LAB — BASIC METABOLIC PANEL
BUN/Creatinine Ratio: 13 (ref 9–23)
BUN: 9 mg/dL (ref 6–24)
CO2: 22 mmol/L (ref 20–29)
Calcium: 9.2 mg/dL (ref 8.7–10.2)
Chloride: 106 mmol/L (ref 96–106)
Creatinine, Ser: 0.71 mg/dL (ref 0.57–1.00)
GFR calc Af Amer: 112 mL/min/{1.73_m2} (ref >59)
GFR calc non Af Amer: 98 mL/min/{1.73_m2} (ref >59)
Glucose: 116 mg/dL — ABNORMAL HIGH (ref 65–99)
Potassium: 3.9 mmol/L (ref 3.5–5.2)
Sodium: 142 mmol/L (ref 134–144)

## 2019-10-31 LAB — THYROID PANEL WITH TSH
Free Thyroxine Index: 2.3 (ref 1.2–4.9)
T3 Uptake Ratio: 28 % (ref 24–39)
T4, Total: 8.2 ug/dL (ref 4.5–12.0)
TSH: 2.6 u[IU]/mL (ref 0.450–4.500)

## 2019-11-04 ENCOUNTER — Telehealth: Payer: Self-pay | Admitting: Family

## 2019-11-05 LAB — CDIFF NAA+O+P+STOOL CULTURE
E coli, Shiga toxin Assay: NEGATIVE
Toxigenic C. Difficile by PCR: NEGATIVE

## 2019-11-05 NOTE — Telephone Encounter (Signed)
Christy addres please

## 2019-11-07 NOTE — Telephone Encounter (Signed)
Stool sample was negative

## 2019-11-07 NOTE — Telephone Encounter (Signed)
Attempted to contact patient - NVM 

## 2019-11-11 NOTE — Telephone Encounter (Signed)
Aware of results per viewing through My Chart.

## 2019-11-12 ENCOUNTER — Telehealth (INDEPENDENT_AMBULATORY_CARE_PROVIDER_SITE_OTHER): Payer: Commercial Managed Care - PPO | Admitting: Family Medicine

## 2019-11-12 ENCOUNTER — Encounter: Payer: Self-pay | Admitting: Family Medicine

## 2019-11-12 DIAGNOSIS — B029 Zoster without complications: Secondary | ICD-10-CM | POA: Diagnosis not present

## 2019-11-12 MED ORDER — VALACYCLOVIR HCL 1 G PO TABS: 1000.0000 mg | ORAL_TABLET | Freq: Three times a day (TID) | ORAL | 0 refills | Status: AC

## 2019-11-12 NOTE — Patient Instructions (Signed)
Shingles  Shingles is an infection. It gives you a painful skin rash and blisters that have fluid in them. Shingles is caused by the same germ (virus) that causes chickenpox. Shingles only happens in people who:  Have had chickenpox.  Have been given a shot of medicine (vaccine) to protect against chickenpox. Shingles is rare in this group. The first symptoms of shingles may be itching, tingling, or pain in an area on your skin. A rash will show on your skin a few days or weeks later. The rash is likely to be on one side of your body. The rash usually has a shape like a belt or a band. Over time, the rash turns into fluid-filled blisters. The blisters will break open, change into scabs, and dry up. Medicines may:  Help with pain and itching.  Help you get better sooner.  Help to prevent long-term problems. Follow these instructions at home: Medicines  Take over-the-counter and prescription medicines only as told by your doctor.  Put on an anti-itch cream or numbing cream where you have a rash, blisters, or scabs. Do this as told by your doctor. Helping with itching and discomfort   Put cold, wet cloths (cold compresses) on the area of the rash or blisters as told by your doctor.  Cool baths can help you feel better. Try adding baking soda or dry oatmeal to the water to lessen itching. Do not bathe in hot water. Blister and rash care  Keep your rash covered with a loose bandage (dressing).  Wear loose clothing that does not rub on your rash.  Keep your rash and blisters clean. To do this, wash the area with mild soap and cool water as told by your doctor.  Check your rash every day for signs of infection. Check for: ? More redness, swelling, or pain. ? Fluid or blood. ? Warmth. ? Pus or a bad smell.  Do not scratch your rash. Do not pick at your blisters. To help you to not scratch: ? Keep your fingernails clean and cut short. ? Wear gloves or mittens when you sleep, if  scratching is a problem. General instructions  Rest as told by your doctor.  Keep all follow-up visits as told by your doctor. This is important.  Wash your hands often with soap and water. If soap and water are not available, use hand sanitizer. Doing this lowers your chance of getting a skin infection caused by germs (bacteria).  Your infection can cause chickenpox in people who have never had chickenpox or never got a shot of chickenpox vaccine. If you have blisters that did not change into scabs yet, try not to touch other people or be around other people, especially: ? Babies. ? Pregnant women. ? Children who have areas of red, itchy, or rough skin (eczema). ? Very old people who have transplants. ? People who have a long-term (chronic) sickness, like cancer or AIDS. Contact a doctor if:  Your pain does not get better with medicine.  Your pain does not get better after the rash heals.  You have any signs of infection in the rash area. These signs include: ? More redness, swelling, or pain around the rash. ? Fluid or blood coming from the rash. ? The rash area feeling warm to the touch. ? Pus or a bad smell coming from the rash. Get help right away if:  The rash is on your face or nose.  You have pain in your face or pain by   your eye.  You lose feeling on one side of your face.  You have trouble seeing.  You have ear pain, or you have ringing in your ear.  You have a loss of taste.  Your condition gets worse. Summary  Shingles gives you a painful skin rash and blisters that have fluid in them.  Shingles is an infection. It is caused by the same germ (virus) that causes chickenpox.  Keep your rash covered with a loose bandage (dressing). Wear loose clothing that does not rub on your rash.  If you have blisters that did not change into scabs yet, try not to touch other people or be around people. This information is not intended to replace advice given to you by  your health care provider. Make sure you discuss any questions you have with your health care provider. Document Revised: 11/16/2018 Document Reviewed: 03/29/2017 Elsevier Patient Education  2020 [employer]vier Inc.  

## 2019-11-12 NOTE — Progress Notes (Signed)
MyChart Video visit  Subjective: CC:?  Shingles PCP: Sharion Balloon, FNP [id number] Kendra Fuller is a 54 y.o. female. Patient provides verbal consent for consult held via video.  Due to COVID-19 pandemic this visit was conducted virtually. This visit type was conducted due to national recommendations for restrictions regarding the COVID-19 Pandemic (e.g. social distancing, sheltering in place) in an effort to limit this patient's exposure and mitigate transmission in our community. All issues noted in this document were discussed and addressed.  A physical exam was not performed with this format.   Location of patient: Outside Location of provider: [facility] Others present for call: None  1.  Rash Patient reports that she abruptly developed a rash on her nose overnight.  She notes that it appeared vesicular and had a slight tingling and burning sensation.  There are 3 little vesicles noted at the tip of her nose.  She has had some burning of bilateral eyes but was not sure if this was related.  Denies any visual disturbance or lesions near the eyes.  She has had 1 out of 2 shingles vaccine.  She had the first vaccine administered at [facility] in October she believes but unfortunately was lost to follow-up for the booster.   ROS: Per HPI  Allergies  Allergen Reactions  . Oxycodone     Hallucinations   Past Medical History:  Diagnosis Date  . Congestion of upper airway    sinus and chest 1 week, fever one nite  . GERD (gastroesophageal reflux disease)    occ  . Personal history of colonic polyps 10/21/2013   Laparoscopic sigmoid colectomy July 2012  Collected Date: 03/08/2011 Received Date: 03/09/2011 Physician: Aaron Edelman D. 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 -  SURGICAL RESECTION MARGINS, NEGATIVE FOR DYSPLASIA OR MALIGNANCY. - SIXTEEN LYMPH NODES, NEGATIVE FOR TUMOR (0/16). - ENDOSCOPIC TATTOO PRESENT [name] Pathologist, Electronic Signature (Case signed 03/10/2011) Specimen Gross and Clinical Information Specimen(s) Obtained: Colon, segmental resection for tumor, splenic flexor with polyp   . Rectal bleeding   . Thyroid disease    hypothyroidism    Current Outpatient Medications:  .  albuterol (VENTOLIN HFA) 108 (90 Base) MCG/ACT inhaler, Inhale 2 puffs into the lungs every 6 (six) hours as needed for wheezing or shortness of breath., Disp: 8 g, Rfl: 2 .  aspirin EC 81 MG tablet, Take 1 tablet (81 mg total) by mouth daily., Disp: 90 tablet, Rfl: 1 .  atorvastatin (LIPITOR) 20 MG tablet, TAKE 1 TABLET BY MOUTH EVERY DAY, Disp: 30 tablet, Rfl: 3 .  Cholecalciferol (VITAMIN D) 2000 units CAPS, Take by mouth., Disp: , Rfl:  .  escitalopram (LEXAPRO) 20 MG tablet, Take 1 tablet (20 mg total) by mouth daily., Disp: 90 tablet, Rfl: 2 .  fluticasone (FLONASE) 50 MCG/ACT nasal spray, Place 2 sprays into both nostrils daily., Disp: 16 g, Rfl: 6 .  Krill Oil 500 MG CAPS, Take by mouth., Disp: , Rfl:  .  levothyroxine (SYNTHROID) 88 MCG tablet, Take 1 tablet (88 mcg total) by mouth daily., Disp: 90 tablet, Rfl: 1 .  Magnesium Hydroxide (MAGNESIA PO), Take by mouth., Disp: , Rfl:  .  OVER THE COUNTER MEDICATION, CQ 10 daily, Disp: , Rfl:   General: well-appearing.  No acute distress HEENT:  Erythematous, vesicular rash noted along the right side of the vestibule.  Assessment/ Plan: 54 y.o. female   1. Herpes zoster without complication Highly suspicious for herpes zoster infection.  Given the area, we discussed that any ocular symptoms that are ongoing or worsening she is to seek immediate medical attention because there is involvement of the nasociliary branch of the trigeminal nerve, which obviously would also innervate the eyeball.  I have gone ahead  and put her on Valtrex 100 mg 3 times daily for 7 days.  She will follow-up if she develops any other concerning symptoms or signs.  Advised to keep covered given the contagious characteristics of this virus. - valACYclovir (VALTREX) 1000 MG tablet; Take 1 tablet (1,000 mg total) by mouth 3 (three) times daily for 7 days.  Dispense: 21 tablet; Refill: 0   Start time: 5:54pm End time: 6:02pm  Total time spent on patient care (including video visit/ documentation): 18 minutes  [place], [place] [phone]

## 2019-12-18 ENCOUNTER — Other Ambulatory Visit: Payer: Self-pay | Admitting: Family

## 2019-12-18 DIAGNOSIS — J449 Chronic obstructive pulmonary disease, unspecified: Secondary | ICD-10-CM

## 2019-12-24 ENCOUNTER — Telehealth: Payer: Self-pay | Admitting: Family

## 2019-12-24 NOTE — Telephone Encounter (Signed)
Pt fax stool results to Dr Michail Sermon at [facility] [place] phone [phone]

## 2019-12-25 NOTE — Telephone Encounter (Signed)
FAXED STOOL RESULTS TO [facility]

## 2020-01-02 ENCOUNTER — Other Ambulatory Visit: Payer: Self-pay | Admitting: *Deleted

## 2020-01-02 MED ORDER — ATORVASTATIN CALCIUM 20 MG PO TABS: 20.0000 mg | ORAL_TABLET | Freq: Every day | ORAL | 2 refills | Status: DC

## 2020-01-23 ENCOUNTER — Other Ambulatory Visit: Payer: Self-pay | Admitting: Family

## 2020-01-23 DIAGNOSIS — J449 Chronic obstructive pulmonary disease, unspecified: Secondary | ICD-10-CM

## 2020-02-16 ENCOUNTER — Other Ambulatory Visit: Payer: Self-pay | Admitting: Family

## 2020-02-16 DIAGNOSIS — J449 Chronic obstructive pulmonary disease, unspecified: Secondary | ICD-10-CM

## 2020-02-26 ENCOUNTER — Ambulatory Visit
Admission: RE | Admit: 2020-02-26 | Discharge: 2020-02-26 | Disposition: A | Discharge status: Home / Self Care | Payer: Commercial Managed Care - PPO | Source: Ambulatory Visit | Attending: Family | Admitting: Family

## 2020-02-26 ENCOUNTER — Other Ambulatory Visit: Payer: Self-pay

## 2020-02-26 ENCOUNTER — Other Ambulatory Visit: Payer: Self-pay | Admitting: Family

## 2020-02-26 DIAGNOSIS — N6310 Unspecified lump in the right breast, unspecified quadrant: Secondary | ICD-10-CM

## 2020-02-26 DIAGNOSIS — N6320 Unspecified lump in the left breast, unspecified quadrant: Secondary | ICD-10-CM

## 2020-03-20 ENCOUNTER — Other Ambulatory Visit: Payer: Self-pay | Admitting: Family

## 2020-03-20 DIAGNOSIS — J449 Chronic obstructive pulmonary disease, unspecified: Secondary | ICD-10-CM

## 2020-03-26 ENCOUNTER — Other Ambulatory Visit: Payer: Self-pay | Admitting: Family

## 2020-03-30 ENCOUNTER — Other Ambulatory Visit: Payer: Self-pay | Admitting: Family

## 2020-04-16 IMAGING — MG DIGITAL DIAGNOSTIC BILATERAL MAMMOGRAM WITH TOMO AND CAD
6 of 12 series · 6 of 36 positions shown · non-contrast
Comparison: Previous exam(s).

CLINICAL DATA: Patient was called back from screening mammogram for
possible masses in both breast.

EXAM:
DIGITAL DIAGNOSTIC LEFT MAMMOGRAM WITH CAD AND TOMO
ULTRASOUND LEFT BREAST

[R MLO synth-2D (1 of 2)]
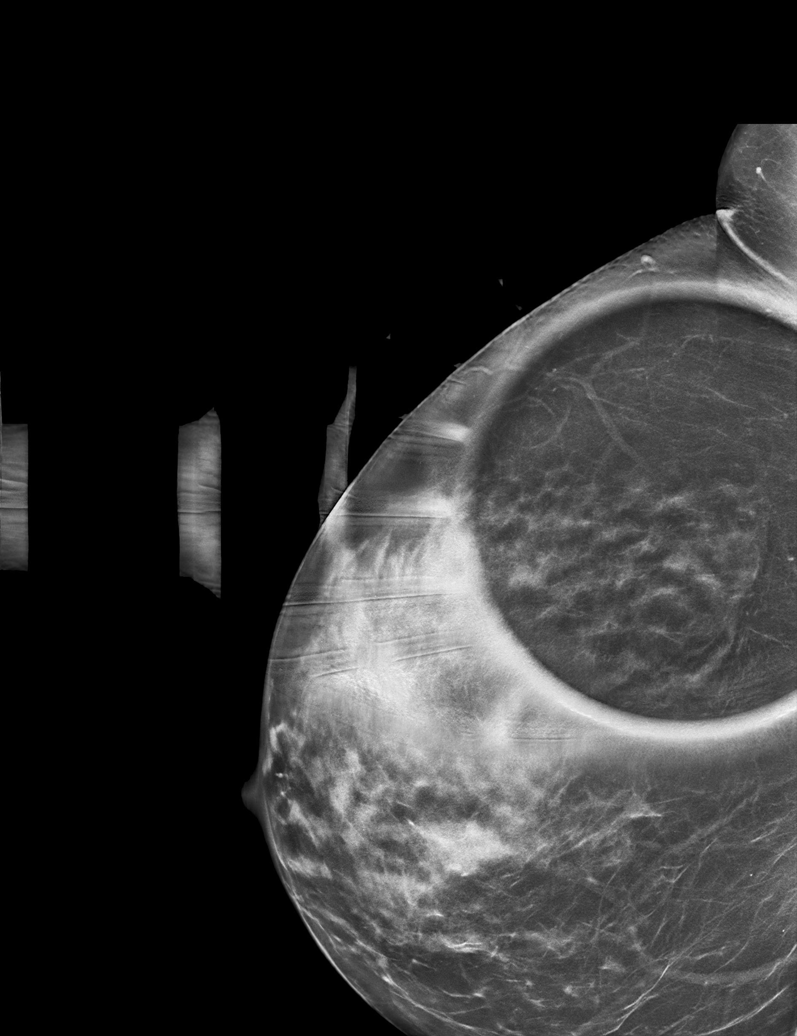

[R CC synth-2D]
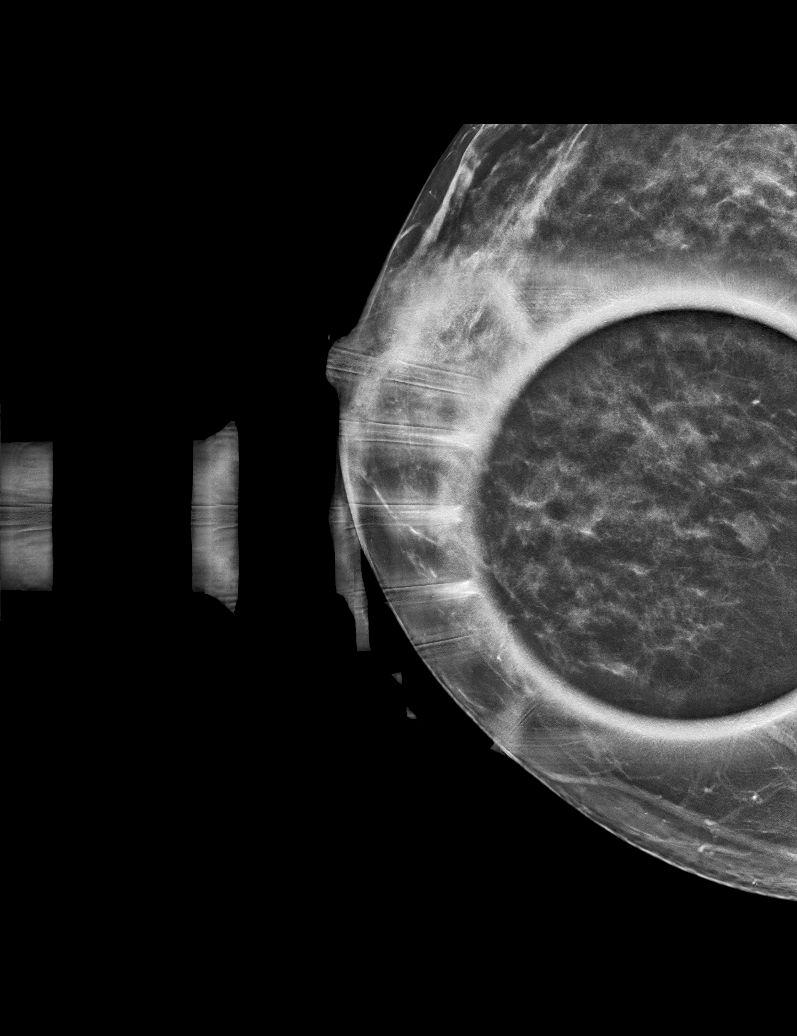

[R ML synth-2D]
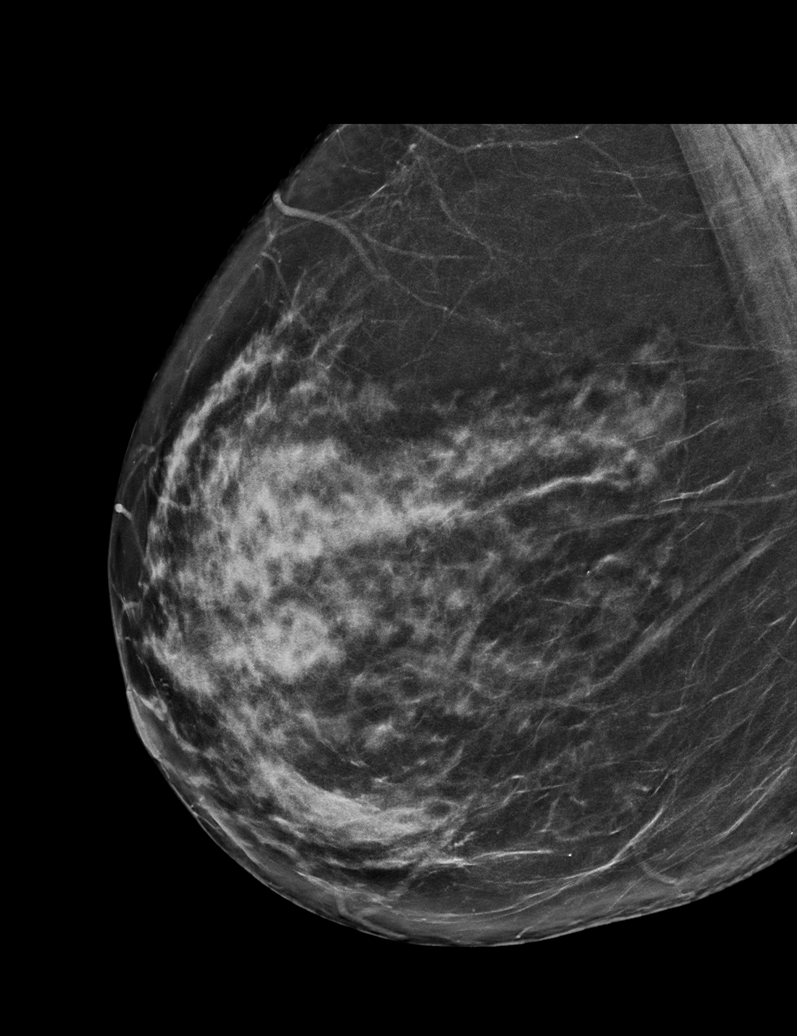

[L CC synth-2D]
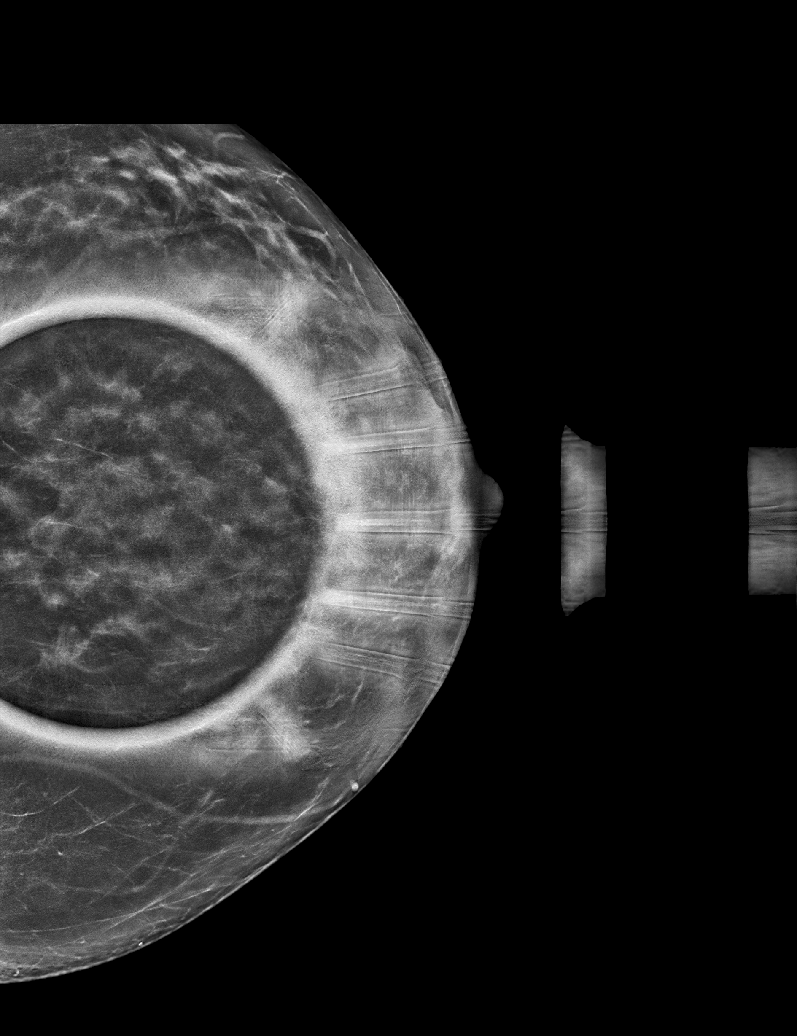

[R MLO synth-2D (2 of 2)]
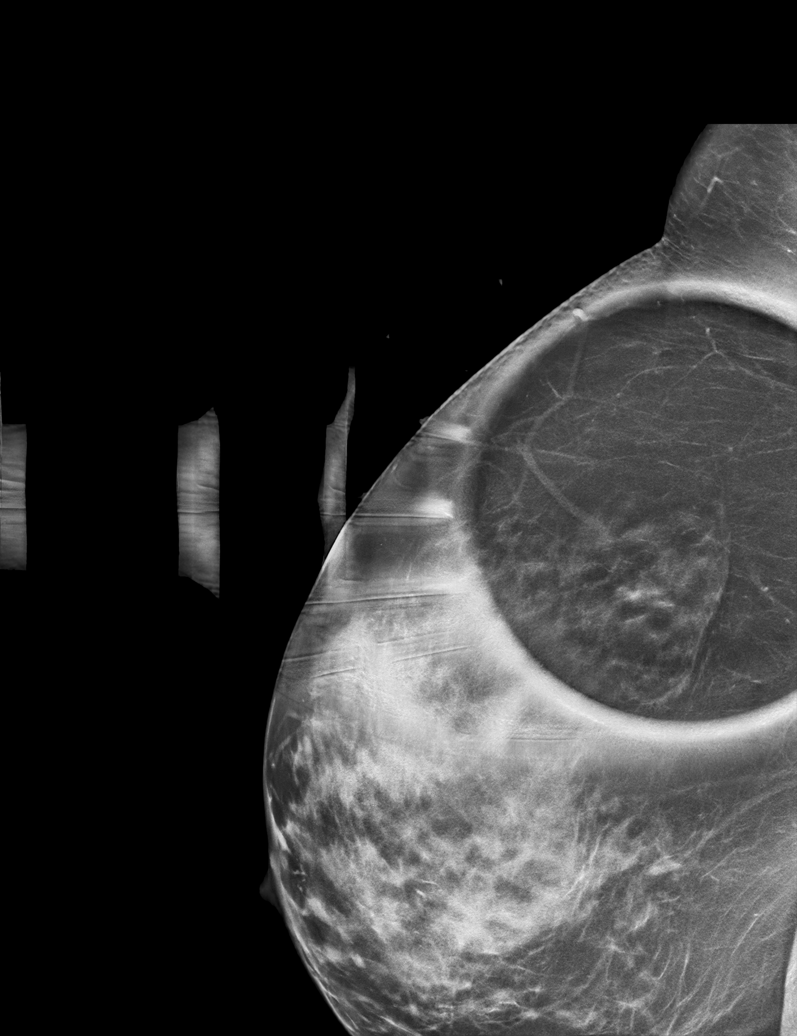

[L MLO synth-2D]
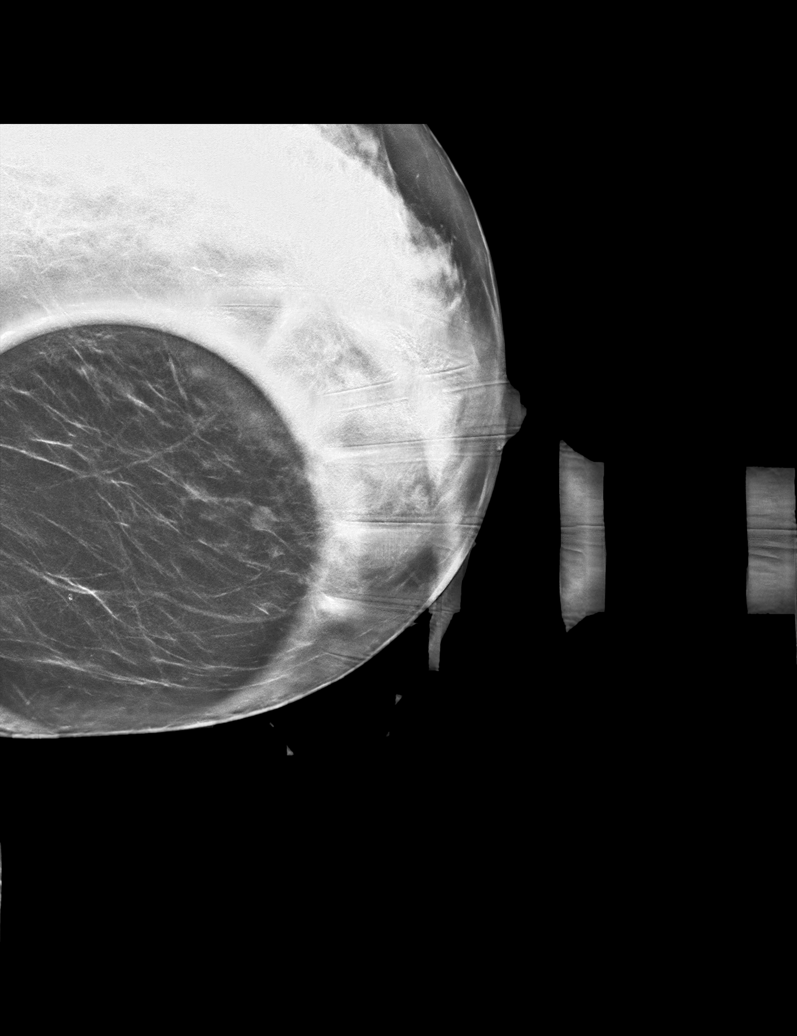

[6 of 36 positions shown; findings below may reference images not displayed]

ACR Breast Density Category c: The breast tissue is heterogeneously
dense, which may obscure small masses.
FINDINGS: Additional imaging of both breast was performed. In the medial
aspect of the right breast there is persistence asymmetry. It is
similar appearance to prior mammogram dated 10/13/2010. It is not
seen on the lateral view. In the lower outer quadrant of the right
breast there is persistent 4 mm mass. There are no malignant type
microcalcifications.

Mammographic images were processed with CAD.

Targeted ultrasound is performed, showing normal tissue in the
medial aspect of the right breast. No solid or cystic mass, abnormal
shadowing or distortion visualized. Sonographic evaluation of the
left breast shows a well-circumscribed hypoechoic nodule at 4
o'clock 3 cm from the nipple measuring 4 x 4 x 5 mm. It is felt to
likely be benign and may be a complex cyst.
IMPRESSION: Probable benign asymmetry in the right breast and mass in the left
breast.

RECOMMENDATION:
Bilateral diagnostic mammogram and left breast ultrasound in 6
months is recommended.

I have discussed the findings and recommendations with the patient.
Results were also provided in writing at the conclusion of the
visit. If applicable, a reminder letter will be sent to the patient
regarding the next appointment.

BI-RADS CATEGORY  3: Probably benign.

## 2020-04-22 ENCOUNTER — Other Ambulatory Visit: Payer: Self-pay | Admitting: Family

## 2020-04-22 NOTE — Telephone Encounter (Signed)
Patient needs appointment last seen for follow up 10/01/19.

## 2020-04-23 ENCOUNTER — Other Ambulatory Visit: Payer: Self-pay | Admitting: Family

## 2020-04-23 DIAGNOSIS — J449 Chronic obstructive pulmonary disease, unspecified: Secondary | ICD-10-CM

## 2020-05-19 ENCOUNTER — Other Ambulatory Visit: Payer: Self-pay | Admitting: Family

## 2020-05-19 DIAGNOSIS — J449 Chronic obstructive pulmonary disease, unspecified: Secondary | ICD-10-CM

## 2020-06-01 ENCOUNTER — Other Ambulatory Visit: Payer: Self-pay

## 2020-06-01 MED ORDER — ATORVASTATIN CALCIUM 20 MG PO TABS
20.0000 mg | ORAL_TABLET | Freq: Every day | ORAL | 0 refills | Status: DC
Start: 2020-06-01 — End: 2020-06-24

## 2020-06-01 NOTE — Telephone Encounter (Signed)
  Prescription Request  06/01/2020  What is the name of the medication or equipment? Pt need cholestol meds and she has made appt in November  Have you contacted your pharmacy to request a refill? (if applicable) yes  [facility]   Patient notified that their request is being sent to the clinical staff for review and that they should receive a response within 2 business days.

## 2020-06-01 NOTE — Telephone Encounter (Signed)
NA / NVM setup. Refill sent to pharmacy

## 2020-06-24 ENCOUNTER — Other Ambulatory Visit: Payer: Self-pay | Admitting: Family

## 2020-06-25 ENCOUNTER — Other Ambulatory Visit: Payer: Self-pay | Admitting: Family

## 2020-06-25 ENCOUNTER — Ambulatory Visit (INDEPENDENT_AMBULATORY_CARE_PROVIDER_SITE_OTHER): Payer: Commercial Managed Care - PPO | Admitting: Family

## 2020-06-25 ENCOUNTER — Encounter: Payer: Self-pay | Admitting: Family

## 2020-06-25 ENCOUNTER — Other Ambulatory Visit: Payer: Self-pay

## 2020-06-25 DIAGNOSIS — E039 Hypothyroidism, unspecified: Secondary | ICD-10-CM | POA: Diagnosis not present

## 2020-06-25 DIAGNOSIS — K430 Incisional hernia with obstruction, without gangrene: Secondary | ICD-10-CM

## 2020-06-25 DIAGNOSIS — J449 Chronic obstructive pulmonary disease, unspecified: Secondary | ICD-10-CM

## 2020-06-25 DIAGNOSIS — Z720 Tobacco use: Secondary | ICD-10-CM

## 2020-06-25 DIAGNOSIS — E559 Vitamin D deficiency, unspecified: Secondary | ICD-10-CM | POA: Diagnosis not present

## 2020-06-25 DIAGNOSIS — F331 Major depressive disorder, recurrent, moderate: Secondary | ICD-10-CM

## 2020-06-25 DIAGNOSIS — J069 Acute upper respiratory infection, unspecified: Secondary | ICD-10-CM

## 2020-06-25 DIAGNOSIS — E785 Hyperlipidemia, unspecified: Secondary | ICD-10-CM

## 2020-06-25 MED ORDER — PREDNISONE 10 MG (21) PO TBPK: ORAL_TABLET | ORAL | 0 refills | Status: DC

## 2020-06-25 MED ORDER — LEVOTHYROXINE SODIUM 88 MCG PO TABS: 88.0000 ug | ORAL_TABLET | Freq: Every day | ORAL | 1 refills | Status: DC

## 2020-06-25 MED ORDER — ALBUTEROL SULFATE HFA 108 (90 BASE) MCG/ACT IN AERS: 2.0000 | INHALATION_SPRAY | Freq: Four times a day (QID) | RESPIRATORY_TRACT | 0 refills | Status: DC | PRN

## 2020-06-25 MED ORDER — ESCITALOPRAM OXALATE 20 MG PO TABS: 20.0000 mg | ORAL_TABLET | Freq: Every day | ORAL | 1 refills | Status: DC

## 2020-06-25 MED ORDER — ATORVASTATIN CALCIUM 20 MG PO TABS: 20.0000 mg | ORAL_TABLET | Freq: Every day | ORAL | 1 refills | Status: DC

## 2020-06-25 NOTE — Progress Notes (Signed)
Virtual Visit via telephone Note Due to COVID-19 pandemic this visit was conducted virtually. This visit type was conducted due to national recommendations for restrictions regarding the COVID-19 Pandemic (e.g. social distancing, sheltering in place) in an effort to limit this patient's exposure and mitigate transmission in our community. All issues noted in this document were discussed and addressed.  A physical exam was not performed with this format.  I connected with Kendra Fuller on 06/25/20 at 10:22 AM by telephone and verified that I am speaking with the correct person using two identifiers. Kendra Fuller is currently located at car and no one is currently with her during visit. The provider, [name], FNP is located in their office at time of visit.  I discussed the limitations, risks, security and privacy concerns of performing an evaluation and management service by telephone and the availability of in person appointments. I also discussed with the patient that there may be a patient responsible charge related to this service. The patient expressed understanding and agreed to proceed.   History and Present Illness:  Pt calls to the office today for CPE without pap. She is followed by GYN.  Thyroid Problem Presents for follow-up visit. Patient reports no anxiety, diarrhea, fatigue or hair loss. The symptoms have been stable. Her past medical history is significant for hyperlipidemia.  Hyperlipidemia This is a chronic problem. The current episode started more than 1 year ago. The problem is uncontrolled. Recent lipid tests were reviewed and are high. Exacerbating diseases include hypothyroidism. Current antihyperlipidemic treatment includes statins. The current treatment provides moderate improvement of lipids. Risk factors for coronary artery disease include dyslipidemia and a sedentary lifestyle.  Depression        This is a chronic problem.  The current episode started  more than 1 year ago.   The onset quality is gradual.   The problem occurs intermittently.  Associated symptoms include hopelessness, irritable, restlessness and headaches.  Associated symptoms include no fatigue.  Past treatments include SSRIs - Selective serotonin reuptake inhibitors.  Past medical history includes hypothyroidism and thyroid problem.   URI  This is a new problem. The current episode started 1 to 4 weeks ago. The problem has been gradually worsening. Associated symptoms include congestion, coughing, headaches and rhinorrhea. Pertinent negatives include no diarrhea. She has tried decongestant for the symptoms. The treatment provided mild relief.  Nicotine Dependence Presents for follow-up visit. Symptoms are negative for fatigue. Her urge triggers include company of smokers. She smokes 1 pack of cigarettes per day.   COPD Pt continues to smoke a pack a day. Her breathing is stable.   Review of Systems  Constitutional: Negative for fatigue.  HENT: Positive for congestion and rhinorrhea.   Respiratory: Positive for cough.   Gastrointestinal: Negative for diarrhea.  Neurological: Positive for headaches.  Psychiatric/Behavioral: Positive for depression. The patient is not nervous/anxious.   All other systems reviewed and are negative.    Observations/Objective: No SOB or distress noted, hoarse voice  Assessment and Plan: 1. Chronic obstructive pulmonary disease, unspecified COPD type (HCC) - albuterol (VENTOLIN HFA) 108 (90 Base) MCG/ACT inhaler; Inhale 2 puffs into the lungs every 6 (six) hours as needed for wheezing or shortness of breath. (Needs to be seen before next refill)  Dispense: 18 each; Refill: 0  2. Hypothyroidism, unspecified type - levothyroxine (SYNTHROID) 88 MCG tablet; Take 1 tablet (88 mcg total) by mouth daily.  Dispense: 90 tablet; Refill: 1  3. Vitamin D deficiency  4. Tobacco abuse  5. Incisional hernias x4, incarcerated s/p lap repaair w mesh  12/03/2013  6. Hyperlipidemia, unspecified hyperlipidemia type - atorvastatin (LIPITOR) 20 MG tablet; Take 1 tablet (20 mg total) by mouth daily.  Dispense: 90 tablet; Refill: 1  7. Moderate episode of recurrent major depressive disorder (HCC) - escitalopram (LEXAPRO) 20 MG tablet; Take 1 tablet (20 mg total) by mouth daily.  Dispense: 90 tablet; Refill: 1  8. Viral URI - Take meds as prescribed - Use a cool mist humidifier  -Use saline nose sprays frequently -Force fluids -For any cough or congestion  Use plain Mucinex- regular strength or max strength is fine -For fever or aces or pains- take tylenol or ibuprofen. -Throat lozenges if help -New toothbrush in 3 days - predniSONE (STERAPRED UNI-PAK 21 TAB) 10 MG (21) TBPK tablet; Use as directed  Dispense: 21 tablet; Refill: 0  Will hold off on labs at this time per request from patient Continue medications  Request Pap from [place] in 6 months    I discussed the assessment and treatment plan with the patient. The patient was provided an opportunity to ask questions and all were answered. The patient agreed with the plan and demonstrated an understanding of the instructions.   The patient was advised to call back or seek an in-person evaluation if the symptoms worsen or if the condition fails to improve as anticipated.  The above assessment and management plan was discussed with the patient. The patient verbalized understanding of and has agreed to the management plan. Patient is aware to call the clinic if symptoms persist or worsen. Patient is aware when to return to the clinic for a follow-up visit. Patient educated on when it is appropriate to go to the emergency department.   Time call ended:  10:43 AM  I provided 21 minutes of non-face-to-face time during this encounter.    [name], FNP

## 2020-06-29 ENCOUNTER — Telehealth: Payer: Self-pay

## 2020-06-29 MED ORDER — DOXYCYCLINE HYCLATE 100 MG PO TABS: 100.0000 mg | ORAL_TABLET | Freq: Two times a day (BID) | ORAL | 0 refills | Status: DC

## 2020-06-29 NOTE — Telephone Encounter (Signed)
Patient aware and verbalizes understanding. 

## 2020-06-29 NOTE — Telephone Encounter (Signed)
Doxycycline Prescription sent to pharmacy   

## 2020-07-01 ENCOUNTER — Telehealth: Payer: Self-pay

## 2020-07-01 DIAGNOSIS — J069 Acute upper respiratory infection, unspecified: Secondary | ICD-10-CM

## 2020-07-01 MED ORDER — PREDNISONE 10 MG (21) PO TBPK: ORAL_TABLET | ORAL | 0 refills | Status: DC

## 2020-07-01 NOTE — Telephone Encounter (Signed)
Patient was given prednisone last week from [facility] for sinus turning  into bronchitis, she stated it helped a lot. She does sound very congested and horse this morning on the phone. She is asking for another round no appts available. Covering pcp please advise. [facility]

## 2020-07-01 NOTE — Telephone Encounter (Signed)
I sent a refill of the prednisone, if is still not better after that have her make another appointment

## 2020-07-01 NOTE — Telephone Encounter (Signed)
  Prescription Request  07/01/2020  What is the name of the medication or equipment? Pt needs another rx for prednisone  Have you contacted your pharmacy to request a refill? (if applicable) no  [facility]   Patient notified that their request is being sent to the clinical staff for review and that they should receive a response within 2 business days.

## 2020-07-01 NOTE — Telephone Encounter (Signed)
Patient aware and verbalized understanding. °

## 2020-07-06 ENCOUNTER — Telehealth: Payer: Self-pay

## 2020-07-06 NOTE — Telephone Encounter (Signed)
Dettinger, Fransisca Kaufmann, MD      07/01/20 12:17 PM Note I sent a refill of the prednisone, if is still not better after that have her make another appointment      Per note - [facility] Appointment scheduled

## 2020-07-07 ENCOUNTER — Ambulatory Visit (INDEPENDENT_AMBULATORY_CARE_PROVIDER_SITE_OTHER): Payer: Commercial Managed Care - PPO | Admitting: Family Medicine

## 2020-07-07 ENCOUNTER — Other Ambulatory Visit: Payer: Self-pay

## 2020-07-07 VITALS — BP 169/88 | HR 78 | Temp 96.9°F

## 2020-07-07 DIAGNOSIS — J209 Acute bronchitis, unspecified: Secondary | ICD-10-CM

## 2020-07-07 DIAGNOSIS — J440 Chronic obstructive pulmonary disease with acute lower respiratory infection: Secondary | ICD-10-CM

## 2020-07-07 MED ORDER — TRELEGY ELLIPTA 100-62.5-25 MCG/INH IN AEPB: 1.0000 | INHALATION_SPRAY | Freq: Every day | RESPIRATORY_TRACT | 0 refills | Status: DC

## 2020-07-07 NOTE — Progress Notes (Signed)
Subjective: CC: URI PCP: Sharion Balloon, FNP YIA:[id number] Kendra Fuller is a 54 y.o. female presenting to clinic today for:  1. URI/laryngitis Patient was seen via video visit on 06/25/2020. She was started on antibiotics and is almost completed the doxycycline. She is status post 2 rounds of oral prednisone. She is been using her albuterol inhaler intermittently. She has been told that she has COPD in the past and does actively smoke. No hemoptysis. She reports myalgia but no fevers. Nasal and chest congestion reported.   ROS: Per HPI  Allergies  Allergen Reactions  . Oxycodone     Hallucinations   Past Medical History:  Diagnosis Date  . Congestion of upper airway    sinus and chest 1 week, fever one nite  . GERD (gastroesophageal reflux disease)    occ  . Personal history of colonic polyps 10/21/2013   Laparoscopic sigmoid colectomy July 2012  Collected Date: 03/08/2011 Received Date: 03/09/2011 Physician: Aaron Edelman D. 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 - SURGICAL RESECTION MARGINS, NEGATIVE FOR DYSPLASIA OR MALIGNANCY. - SIXTEEN LYMPH NODES, NEGATIVE FOR TUMOR (0/16). - ENDOSCOPIC TATTOO PRESENT [name] Pathologist, Electronic Signature (Case signed 03/10/2011) Specimen Gross and Clinical Information Specimen(s) Obtained: Colon, segmental resection for tumor, splenic flexor with polyp   . Rectal bleeding   . Thyroid disease    hypothyroidism    Current Outpatient Medications:  .  albuterol (VENTOLIN HFA) 108 (90 Base) MCG/ACT inhaler, Inhale 2 puffs into the lungs every 6 (six) hours as needed for wheezing or shortness of breath. (Needs to be seen before next refill), Disp: 18 each, Rfl: 0 .  aspirin EC 81 MG tablet, Take 1 tablet (81 mg  total) by mouth daily., Disp: 90 tablet, Rfl: 1 .  atorvastatin (LIPITOR) 20 MG tablet, Take 1 tablet (20 mg total) by mouth daily., Disp: 90 tablet, Rfl: 1 .  Cholecalciferol (VITAMIN D) 2000 units CAPS, Take by mouth., Disp: , Rfl:  .  doxycycline (VIBRA-TABS) 100 MG tablet, Take 1 tablet (100 mg total) by mouth 2 (two) times daily., Disp: 20 tablet, Rfl: 0 .  escitalopram (LEXAPRO) 20 MG tablet, Take 1 tablet (20 mg total) by mouth daily., Disp: 90 tablet, Rfl: 1 .  fluticasone (FLONASE) 50 MCG/ACT nasal spray, Place 2 sprays into both nostrils daily., Disp: 16 g, Rfl: 6 .  Fluticasone-Umeclidin-Vilant (TRELEGY ELLIPTA) 100-62.5-25 MCG/INH AEPB, Inhale 1 Inhaler into the lungs daily., Disp: 28 each, Rfl: 0 .  Krill Oil 500 MG CAPS, Take by mouth., Disp: , Rfl:  .  levothyroxine (SYNTHROID) 88 MCG tablet, Take 1 tablet (88 mcg total) by mouth daily., Disp: 90 tablet, Rfl: 1 .  Magnesium Hydroxide (MAGNESIA PO), Take by mouth., Disp: , Rfl:  .  OVER THE COUNTER MEDICATION, CQ 10 daily, Disp: , Rfl:  .  predniSONE (STERAPRED UNI-PAK 21 TAB) 10 MG (21) TBPK tablet, Use as directed, Disp: 21 tablet, Rfl: 0 Social History   Socioeconomic History  . Marital status: Single    Spouse name: Not on file  . Number of children: Not on file  . Years of education: Not on file  . Highest education level: Not on file  Occupational History  . Not on file  Tobacco Use  . Smoking status: Current  Every Day Smoker    Packs/day: 1.00    Years: 12.00    Pack years: 12.00    Types: Cigarettes  . Smokeless tobacco: Never Used  Substance and Sexual Activity  . Alcohol use: Yes    Comment: occ  . Drug use: No  . Sexual activity: Not on file  Other Topics Concern  . Not on file  Social History Narrative  . Not on file   Social Determinants of Health   Financial Resource Strain:   . Difficulty of Paying Living Expenses: Not on file  Food Insecurity:   . Worried About Charity fundraiser in the Last  Year: Not on file  . Ran Out of Food in the Last Year: Not on file  Transportation Needs:   . Lack of Transportation (Medical): Not on file  . Lack of Transportation (Non-Medical): Not on file  Physical Activity:   . Days of Exercise per Week: Not on file  . Minutes of Exercise per Session: Not on file  Stress:   . Feeling of Stress : Not on file  Social Connections:   . Frequency of Communication with Friends and Family: Not on file  . Frequency of Social Gatherings with Friends and Family: Not on file  . Attends Religious Services: Not on file  . Active Member of Clubs or Organizations: Not on file  . Attends Archivist Meetings: Not on file  . Marital Status: Not on file  Intimate Partner Violence:   . Fear of Current or Ex-Partner: Not on file  . Emotionally Abused: Not on file  . Physically Abused: Not on file  . Sexually Abused: Not on file   Family History  Problem Relation Age of Onset  . Cancer Mother        lung cancer  . Heart disease Father     Objective: Office vital signs reviewed. BP (!) 169/88   Pulse 78   Temp (!) 96.9 F (36.1 C) (Temporal)   LMP 10/07/2014   SpO2 100%   Physical Examination:  General: Awake, alert, nontoxic, No acute distress HEENT: Normal; sclera white. Patient is hoarse Cardio: regular rate and rhythm, S1S2 heard, no murmurs appreciated Pulm: Global expiratory wheezes and intermittent rhonchi noted. Normal work of breathing on room air  Assessment/ Plan: 54 y.o. female   Acute bronchitis with COPD ([place]) - Plan: Novel Coronavirus, NAA ([facility])  I suspect that she indeed has acute bronchitis that is viral in etiology. She is status post treatment with prednisone and with antibiotics, which is appropriate. She has ongoing wheezes throughout on her exam today. I have placed her on a new inhaler. We discussed how to use this and this was demonstrated in the office. If she has no significant improvement within the next week  I have recommended that she return for chest x-rays and possible repeat antibiotics. Symptomatically she did feel somewhat better after Trelegy inhalation.  Orders Placed This Encounter  Procedures  . Novel Coronavirus, NAA ([facility])    Order Specific Question:   Is this test for diagnosis or screening    Answer:   Diagnosis of ill patient    Order Specific Question:   Symptomatic for COVID-19 as defined by CDC    Answer:   Yes    Order Specific Question:   Date of Symptom Onset    Answer:   06/24/2020    Order Specific Question:   Hospitalized for COVID-19    Answer:   No  Order Specific Question:   Admitted to ICU for COVID-19    Answer:   No    Order Specific Question:   Previously tested for COVID-19    Answer:   No    Order Specific Question:   Resident in a congregate (group) care setting    Answer:   No    Order Specific Question:   Is the patient student?    Answer:   No    Order Specific Question:   Employed in healthcare setting    Answer:   No    Order Specific Question:   Pregnant    Answer:   No    Order Specific Question:   Has patient completed COVID vaccination(s) (2 doses of Pfizer/Moderna 1 dose of [employer])    Answer:   No   Meds ordered this encounter  Medications  . Fluticasone-Umeclidin-Vilant (TRELEGY ELLIPTA) 100-62.5-25 MCG/INH AEPB    Sig: Inhale 1 Inhaler into the lungs daily.    Dispense:  28 each    Refill:  [place], [place] [phone]

## 2020-07-07 NOTE — Patient Instructions (Addendum)
Use trelegy once daily RINSE mouth after each use! Ok to continue albuterol 2 puffs every 6 hours for the next 48 hours then only use as needed.  If no improvement in 1 week, I want you to come back in for recheck.    It appears that you have a viral upper respiratory infection (cold).  Cold symptoms can last up to 2 weeks.    - Get plenty of rest and drink plenty of fluids. - Try to breathe moist air. Use a cold mist humidifier. - Consume warm fluids (soup or tea) to provide relief for a stuffy nose and to loosen phlegm. - For nasal stuffiness, try saline nasal spray or a Neti Pot. Afrin nasal spray can also be used but this product should not be used longer than 3 days or it will cause rebound nasal stuffiness (worsening nasal congestion). - For sore throat pain relief: use chloraseptic spray, suck on throat lozenges, hard candy or popsicles; gargle with warm salt water (1/4 tsp. salt per 8 oz. of water); and eat soft, bland foods. - Eat a well-balanced diet. If you cannot, ensure you are getting enough nutrients by taking a daily multivitamin. - Avoid dairy products, as they can thicken phlegm. - Avoid alcohol, as it impairs your body's immune system.  CONTACT YOUR DOCTOR IF YOU EXPERIENCE ANY OF THE FOLLOWING: - High fever - Ear pain - Sinus-type headache - Unusually severe cold symptoms - Cough that gets worse while other cold symptoms improve - Flare up of any chronic lung problem, such as asthma - Your symptoms persist longer than 2 weeks

## 2020-07-08 LAB — NOVEL CORONAVIRUS, NAA: SARS-CoV-2, NAA: NOT DETECTED

## 2020-07-08 LAB — SARS-COV-2, NAA 2 DAY TAT

## 2020-07-17 ENCOUNTER — Telehealth: Payer: Self-pay

## 2020-07-20 NOTE — Telephone Encounter (Signed)
Sounds like she needs to be reevaluated, I know she has been seen twice but still not getting better with the treatment usually means we need to come back and reassess and figure out what is going on.  Please schedule for a visit

## 2020-07-20 NOTE — Telephone Encounter (Signed)
Appointment scheduled.

## 2020-07-20 NOTE — Telephone Encounter (Signed)
Patient has had a visit on 11/18 & 11/30 and states she is no better.  Please advise what the next step is. Covering PCP

## 2020-07-27 ENCOUNTER — Other Ambulatory Visit: Payer: Self-pay

## 2020-07-27 ENCOUNTER — Encounter: Payer: Self-pay | Admitting: Family Medicine

## 2020-07-27 ENCOUNTER — Ambulatory Visit (INDEPENDENT_AMBULATORY_CARE_PROVIDER_SITE_OTHER): Payer: Commercial Managed Care - PPO | Admitting: Family Medicine

## 2020-07-27 VITALS — BP 143/83 | HR 92 | Temp 97.3°F

## 2020-07-27 DIAGNOSIS — J040 Acute laryngitis: Secondary | ICD-10-CM | POA: Diagnosis not present

## 2020-07-27 MED ORDER — PREDNISONE 20 MG PO TABS: ORAL_TABLET | ORAL | 0 refills | Status: DC

## 2020-07-27 MED ORDER — AMOXICILLIN-POT CLAVULANATE 875-125 MG PO TABS: 1.0000 | ORAL_TABLET | Freq: Two times a day (BID) | ORAL | 0 refills | Status: DC

## 2020-07-27 NOTE — Progress Notes (Signed)
BP (!) 143/83   Pulse 92   Temp (!) 97.3 F (36.3 C)   LMP 10/07/2014   SpO2 99%    Subjective:   Patient ID: Kendra Fuller, female    DOB: [birth year], 54 y.o.   MRN: [id number]  HPI: Kendra Fuller is a 54 y.o. female presenting on 07/27/2020 for URI (Voice changes for one month. Sinus is draining but can have nasal congestion as well. Denies fever or feeling bad. Has finished the prednisone and ATB few weeks ago. Completed Trelegy last Monday Pt smokes 1 ppd of cigarettes.)   HPI In today with complaints of 1 month issues.  She has been having some sinus drainage and chest congestion with nasal congestion that started about a month ago when she was given initially prednisone and then another course of prednisone and then she was given an antibiotic a few weeks ago and a lot of her other symptoms including chest congestion and drainage has mostly cleared up.  She does still have a little bit of cough but she is a smoker and is likely her smoker's cough.  She is on Trelegy but that was not new during this time and she has been on it for some more.  Patient denies any fevers or chills but still just has a lot of hoarseness.  She is still been using Flonase and doing salt water gargles.  She is not currently taking an antihistamine orally but does take some other medicines to help her  Relevant past medical, surgical, family and social history reviewed and updated as indicated. Interim medical history since our last visit reviewed. Allergies and medications reviewed and updated.  Review of Systems  Constitutional: Negative for chills and fever.  HENT: Positive for congestion, sore throat and voice change. Negative for ear discharge, ear pain, postnasal drip, rhinorrhea, sinus pressure and sneezing.   Eyes: Negative for pain, redness and visual disturbance.  Respiratory: Positive for cough. Negative for chest tightness and shortness of breath.   Cardiovascular: Negative for chest  pain and leg swelling.  Genitourinary: Negative for difficulty urinating and dysuria.  Musculoskeletal: Negative for back pain and gait problem.  Skin: Negative for rash.  Neurological: Negative for light-headedness and headaches.  Psychiatric/Behavioral: Negative for agitation and behavioral problems.  All other systems reviewed and are negative.   Per HPI unless specifically indicated above   Allergies as of 07/27/2020      Reactions   Oxycodone    Hallucinations      Medication List       Accurate as of July 27, 2020  4:45 PM. If you have any questions, ask your nurse or doctor.        STOP taking these medications   doxycycline 100 MG tablet Commonly known as: VIBRA-TABS Stopped by: Fransisca Kaufmann Milina Pagett, MD   predniSONE 10 MG (21) Tbpk tablet Commonly known as: STERAPRED UNI-PAK 21 TAB Stopped by: Worthy Rancher, MD   Trelegy Ellipta 100-62.5-25 MCG/INH Aepb Generic drug: Fluticasone-Umeclidin-Vilant Stopped by: Fransisca Kaufmann Kasiah Manka, MD     TAKE these medications   albuterol 108 (90 Base) MCG/ACT inhaler Commonly known as: VENTOLIN HFA Inhale 2 puffs into the lungs every 6 (six) hours as needed for wheezing or shortness of breath. (Needs to be seen before next refill)   aspirin EC 81 MG tablet Take 1 tablet (81 mg total) by mouth daily.   atorvastatin 20 MG tablet Commonly known as: LIPITOR Take 1 tablet (20 mg total)  by mouth daily.   escitalopram 20 MG tablet Commonly known as: LEXAPRO Take 1 tablet (20 mg total) by mouth daily.   fluticasone 50 MCG/ACT nasal spray Commonly known as: FLONASE Place 2 sprays into both nostrils daily.   Krill Oil 500 MG Caps Take by mouth.   levothyroxine 88 MCG tablet Commonly known as: SYNTHROID Take 1 tablet (88 mcg total) by mouth daily.   MAGNESIA PO Take by mouth.   OVER THE COUNTER MEDICATION CQ 10 daily   Vitamin D 50 MCG (2000 UT) Caps Take by mouth.        Objective:   BP (!) 143/83    Pulse 92   Temp (!) 97.3 F (36.3 C)   LMP 10/07/2014   SpO2 99%   Wt Readings from Last 3 Encounters:  10/01/19 184 lb 9.6 oz (83.7 kg)  10/09/18 175 lb (79.4 kg)  10/03/17 168 lb 6.4 oz (76.4 kg)    Physical Exam Vitals reviewed.  Constitutional:      General: She is not in acute distress.    Appearance: She is well-developed and well-nourished. She is not diaphoretic.  HENT:     Right Ear: Tympanic membrane, ear canal and external ear normal.     Left Ear: Tympanic membrane, ear canal and external ear normal.     Nose: No mucosal edema, rhinorrhea or epistaxis.     Right Sinus: No maxillary sinus tenderness or frontal sinus tenderness.     Left Sinus: No maxillary sinus tenderness or frontal sinus tenderness.     Mouth/Throat:     Mouth: Mucous membranes are normal. Mucous membranes are moist.     Pharynx: Uvula midline. Posterior oropharyngeal edema present. No oropharyngeal exudate, posterior oropharyngeal erythema or uvula swelling.     Tonsils: No tonsillar exudate or tonsillar abscesses.  Eyes:     Extraocular Movements: EOM normal.     Conjunctiva/sclera: Conjunctivae normal.  Cardiovascular:     Rate and Rhythm: Normal rate and regular rhythm.     Pulses: Intact distal pulses.     Heart sounds: Normal heart sounds. No murmur heard.   Pulmonary:     Effort: Pulmonary effort is normal. No respiratory distress.     Breath sounds: Normal breath sounds. No wheezing.  Musculoskeletal:        General: No tenderness or edema. Normal range of motion.  Skin:    General: Skin is warm and dry.     Findings: No rash.  Neurological:     Mental Status: She is alert and oriented to person, place, and time.     Coordination: Coordination normal.  Psychiatric:        Mood and Affect: Mood and affect normal.        Behavior: Behavior normal.       Assessment & Plan:   Problem List Items Addressed This Visit   None   Visit Diagnoses    Laryngitis    -  Primary    Relevant Medications   predniSONE (DELTASONE) 20 MG tablet   amoxicillin-clavulanate (AUGMENTIN) 875-125 MG tablet   Other Relevant Orders   Ambulatory referral to ENT       Follow up plan: Return if symptoms worsen or fail to improve.  Counseling provided for all of the vaccine components No orders of the defined types were placed in this encounter.   Caryl Pina, MD [facility] 07/27/2020, 4:45 PM

## 2020-09-24 ENCOUNTER — Other Ambulatory Visit: Payer: Self-pay

## 2020-09-24 ENCOUNTER — Ambulatory Visit (INDEPENDENT_AMBULATORY_CARE_PROVIDER_SITE_OTHER): Payer: Commercial Managed Care - PPO | Admitting: Family

## 2020-09-24 ENCOUNTER — Encounter: Payer: Self-pay | Admitting: Family

## 2020-09-24 VITALS — BP 125/70 | HR 86 | Temp 97.8°F | Ht 67.0 in | Wt 198.6 lb

## 2020-09-24 DIAGNOSIS — F331 Major depressive disorder, recurrent, moderate: Secondary | ICD-10-CM

## 2020-09-24 DIAGNOSIS — J449 Chronic obstructive pulmonary disease, unspecified: Secondary | ICD-10-CM | POA: Diagnosis not present

## 2020-09-24 DIAGNOSIS — E559 Vitamin D deficiency, unspecified: Secondary | ICD-10-CM | POA: Diagnosis not present

## 2020-09-24 DIAGNOSIS — Z23 Encounter for immunization: Secondary | ICD-10-CM

## 2020-09-24 DIAGNOSIS — E785 Hyperlipidemia, unspecified: Secondary | ICD-10-CM

## 2020-09-24 DIAGNOSIS — E663 Overweight: Secondary | ICD-10-CM

## 2020-09-24 DIAGNOSIS — Z720 Tobacco use: Secondary | ICD-10-CM

## 2020-09-24 DIAGNOSIS — E039 Hypothyroidism, unspecified: Secondary | ICD-10-CM

## 2020-09-24 DIAGNOSIS — Z8249 Family history of ischemic heart disease and other diseases of the circulatory system: Secondary | ICD-10-CM

## 2020-09-24 DIAGNOSIS — Z1159 Encounter for screening for other viral diseases: Secondary | ICD-10-CM

## 2020-09-24 MED ORDER — BUPROPION HCL ER (SR) 150 MG PO TB12: 150.0000 mg | ORAL_TABLET | Freq: Two times a day (BID) | ORAL | 1 refills | Status: DC

## 2020-09-24 MED ORDER — LEVOTHYROXINE SODIUM 88 MCG PO TABS: 88.0000 ug | ORAL_TABLET | Freq: Every day | ORAL | 1 refills | Status: DC

## 2020-09-24 MED ORDER — ALBUTEROL SULFATE HFA 108 (90 BASE) MCG/ACT IN AERS: 2.0000 | INHALATION_SPRAY | Freq: Four times a day (QID) | RESPIRATORY_TRACT | 0 refills | Status: DC | PRN

## 2020-09-24 NOTE — Progress Notes (Signed)
Subjective:    Patient ID: Kendra Fuller, female    DOB: 1965-11-21, 55 y.o.   MRN: [id number]  Chief Complaint  Patient presents with  . Hypothyroidism    Has been out of thyroid meds for 2 days    Pt calls to the office today for chronic follow up. She is followed by GYN.  Thyroid Problem Presents for follow-up visit. Symptoms include anxiety, fatigue and hoarse voice. Patient reports no depressed mood, diaphoresis or diarrhea. The symptoms have been stable. Her past medical history is significant for hyperlipidemia.  Hyperlipidemia This is a chronic problem. The current episode started more than 1 year ago. Exacerbating diseases include obesity. Current antihyperlipidemic treatment includes statins. The current treatment provides moderate improvement of lipids. Risk factors for coronary artery disease include dyslipidemia, hypertension and a sedentary lifestyle.  Depression        This is a chronic problem.  The current episode started more than 1 year ago.   The onset quality is gradual.   The problem occurs intermittently.  Associated symptoms include fatigue, irritable, restlessness and sad.  Past medical history includes thyroid problem.   Nicotine Dependence Presents for follow-up visit. Symptoms include fatigue. Her urge triggers include company of smokers. The symptoms have been stable. She smokes 1 pack of cigarettes per day.  COPD   Review of Systems  Constitutional: Positive for fatigue. Negative for diaphoresis.  HENT: Positive for hoarse voice.   Gastrointestinal: Negative for diarrhea.  Psychiatric/Behavioral: Positive for depression. The patient is nervous/anxious.   All other systems reviewed and are negative.      Objective:   Physical Exam Vitals reviewed.  Constitutional:      General: She is irritable. She is not in acute distress.    Appearance: She is well-developed and well-nourished.  HENT:     Head: Normocephalic and atraumatic.     Right Ear:  Tympanic membrane normal.     Left Ear: Tympanic membrane normal.     Mouth/Throat:     Mouth: Oropharynx is clear and moist.  Eyes:     Pupils: Pupils are equal, round, and reactive to light.  Neck:     Thyroid: No thyromegaly.  Cardiovascular:     Rate and Rhythm: Normal rate and regular rhythm.     Pulses: Intact distal pulses.     Heart sounds: Normal heart sounds. No murmur heard.   Pulmonary:     Effort: Pulmonary effort is normal. No respiratory distress.     Breath sounds: Normal breath sounds. No wheezing.  Abdominal:     General: Bowel sounds are normal. There is no distension.     Palpations: Abdomen is soft.     Tenderness: There is no abdominal tenderness.  Musculoskeletal:        General: No tenderness. Normal range of motion.     Cervical back: Normal range of motion and neck supple.     Right lower leg: Edema (2+) present.     Left lower leg: Edema (2+) present.  Skin:    General: Skin is warm and dry.  Neurological:     Mental Status: She is alert and oriented to person, place, and time.     Cranial Nerves: No cranial nerve deficit.     Deep Tendon Reflexes: Reflexes are normal and symmetric.  Psychiatric:        Mood and Affect: Mood and affect normal.        Behavior: Behavior normal.  Thought Content: Thought content normal.        Judgment: Judgment normal.          BP 125/70   Pulse 86   Temp 97.8 F (36.6 C) (Temporal)   Ht '5\' 7"'  (1.702 m)   Wt 198 lb 9.6 oz (90.1 kg)   LMP 10/07/2014   BMI 31.11 kg/m   Assessment & Plan:  Kendra Fuller comes in today with chief complaint of Hypothyroidism (Has been out of thyroid meds for 2 days )   Diagnosis and orders addressed:  1. Chronic obstructive pulmonary disease, unspecified COPD type ([facility]) - albuterol (VENTOLIN HFA) 108 (90 Base) MCG/ACT inhaler; Inhale 2 puffs into the lungs every 6 (six) hours as needed for wheezing or shortness of breath. (Needs to be seen before next refill)   Dispense: 18 each; Refill: 0 - CMP14+EGFR - CBC with Differential/Platelet - Ambulatory referral to Cardiology - buPROPion (WELLBUTRIN SR) 150 MG 12 hr tablet; Take 1 tablet (150 mg total) by mouth 2 (two) times daily.  Dispense: 180 tablet; Refill: 1  2. Hypothyroidism, unspecified type - levothyroxine (SYNTHROID) 88 MCG tablet; Take 1 tablet (88 mcg total) by mouth daily.  Dispense: 90 tablet; Refill: 1 - CMP14+EGFR - CBC with Differential/Platelet - TSH  3. Tobacco abuse - CMP14+EGFR - CBC with Differential/Platelet - Ambulatory referral to Cardiology - buPROPion (WELLBUTRIN SR) 150 MG 12 hr tablet; Take 1 tablet (150 mg total) by mouth 2 (two) times daily.  Dispense: 180 tablet; Refill: 1  4. Vitamin D deficiency - CMP14+EGFR - CBC with Differential/Platelet  5. Moderate episode of recurrent major depressive disorder ([place]) - Will add Wellbutrin Sr for depression and smoking cessation - CMP14+EGFR - CBC with Differential/Platelet - buPROPion (WELLBUTRIN SR) 150 MG 12 hr tablet; Take 1 tablet (150 mg total) by mouth 2 (two) times daily.  Dispense: 180 tablet; Refill: 1  6. Hyperlipidemia, unspecified hyperlipidemia type - CMP14+EGFR - CBC with Differential/Platelet - Lipid panel - Ambulatory referral to Cardiology  7. Overweight (BMI 25.0-29.9) - CMP14+EGFR - CBC with Differential/Platelet  8. Need for hepatitis C screening test - Hepatitis C antibody - CMP14+EGFR - CBC with Differential/Platelet  9. Need for Tdap vaccination - Tdap vaccine greater than or equal to 7yo IM - CMP14+EGFR - CBC with Differential/Platelet  10. Family history of cardiovascular disease - Ambulatory referral to Cardiology    Labs pending Health Maintenance reviewed Diet and exercise encouraged  Follow up plan: 3 months    [name], FNP

## 2020-09-24 NOTE — Patient Instructions (Signed)
Hypothyroidism  Hypothyroidism is when the thyroid gland does not make enough of certain hormones (it is underactive). The thyroid gland is a small gland located in the lower front part of the neck, just in front of the windpipe (trachea). This gland makes hormones that help control how the body uses food for energy (metabolism) as well as how the heart and brain function. These hormones also play a role in keeping your bones strong. When the thyroid is underactive, it produces too little of the hormones thyroxine (T4) and triiodothyronine (T3). What are the causes? This condition may be caused by:  Hashimoto's disease. This is a disease in which the body's disease-fighting system (immune system) attacks the thyroid gland. This is the most common cause.  Viral infections.  Pregnancy.  Certain medicines.  Birth defects.  Past radiation treatments to the head or neck for cancer.  Past treatment with radioactive iodine.  Past exposure to radiation in the environment.  Past surgical removal of part or all of the thyroid.  Problems with a gland in the center of the brain (pituitary gland).  Lack of enough iodine in the diet. What increases the risk? You are more likely to develop this condition if:  You are female.  You have a family history of thyroid conditions.  You use a medicine called lithium.  You take medicines that affect the immune system (immunosuppressants). What are the signs or symptoms? Symptoms of this condition include:  Feeling as though you have no energy (lethargy).  Not being able to tolerate cold.  Weight gain that is not explained by a change in diet or exercise habits.  Lack of appetite.  Dry skin.  Coarse hair.  Menstrual irregularity.  Slowing of thought processes.  Constipation.  Sadness or depression. How is this diagnosed? This condition may be diagnosed based on:  Your symptoms, your medical history, and a physical exam.  Blood  tests. You may also have imaging tests, such as an ultrasound or MRI. How is this treated? This condition is treated with medicine that replaces the thyroid hormones that your body does not make. After you begin treatment, it may take several weeks for symptoms to go away. Follow these instructions at home:  Take over-the-counter and prescription medicines only as told by your health care provider.  If you start taking any new medicines, tell your health care provider.  Keep all follow-up visits as told by your health care provider. This is important. ? As your condition improves, your dosage of thyroid hormone medicine may change. ? You will need to have blood tests regularly so that your health care provider can monitor your condition. Contact a health care provider if:  Your symptoms do not get better with treatment.  You are taking thyroid hormone replacement medicine and you: ? Sweat a lot. ? Have tremors. ? Feel anxious. ? Lose weight rapidly. ? Cannot tolerate heat. ? Have emotional swings. ? Have diarrhea. ? Feel weak. Get help right away if you have:  Chest pain.  An irregular heartbeat.  A rapid heartbeat.  Difficulty breathing. Summary  Hypothyroidism is when the thyroid gland does not make enough of certain hormones (it is underactive).  When the thyroid is underactive, it produces too little of the hormones thyroxine (T4) and triiodothyronine (T3).  The most common cause is Hashimoto's disease, a disease in which the body's disease-fighting system (immune system) attacks the thyroid gland. The condition can also be caused by viral infections, medicine, pregnancy, or   past radiation treatment to the head or neck.  Symptoms may include weight gain, dry skin, constipation, feeling as though you do not have energy, and not being able to tolerate cold.  This condition is treated with medicine to replace the thyroid hormones that your body does not make. This  information is not intended to replace advice given to you by your health care provider. Make sure you discuss any questions you have with your health care provider. Document Revised: 04/24/2020 Document Reviewed: 04/09/2020 Elsevier Patient Education  2021 Elsevier Inc.  

## 2020-09-25 LAB — CBC WITH DIFFERENTIAL/PLATELET
Basophils Absolute: 0.1 10*3/uL (ref 0.0–0.2)
Basos: 1 %
EOS (ABSOLUTE): 0.3 10*3/uL (ref 0.0–0.4)
Eos: 3 %
Hematocrit: 42.7 % (ref 34.0–46.6)
Hemoglobin: 14.4 g/dL (ref 11.1–15.9)
Immature Grans (Abs): 0.1 10*3/uL (ref 0.0–0.1)
Immature Granulocytes: 1 %
Lymphocytes Absolute: 2.9 10*3/uL (ref 0.7–3.1)
Lymphs: 30 %
MCH: 32.1 pg (ref 26.6–33.0)
MCHC: 33.7 g/dL (ref 31.5–35.7)
MCV: 95 fL (ref 79–97)
Monocytes Absolute: 0.8 10*3/uL (ref 0.1–0.9)
Monocytes: 8 %
Neutrophils Absolute: 5.7 10*3/uL (ref 1.4–7.0)
Neutrophils: 57 %
Platelets: 221 10*3/uL (ref 150–450)
RBC: 4.48 x10E6/uL (ref 3.77–5.28)
RDW: 11.9 % (ref 11.7–15.4)
WBC: 9.9 10*3/uL (ref 3.4–10.8)

## 2020-09-25 LAB — CMP14+EGFR
ALT: 38 IU/L — ABNORMAL HIGH (ref 0–32)
AST: 32 IU/L (ref 0–40)
Albumin/Globulin Ratio: 1.7 (ref 1.2–2.2)
Albumin: 4.3 g/dL (ref 3.8–4.9)
Alkaline Phosphatase: 86 IU/L (ref 44–121)
BUN/Creatinine Ratio: 10 (ref 9–23)
BUN: 7 mg/dL (ref 6–24)
Bilirubin Total: 0.4 mg/dL (ref 0.0–1.2)
CO2: 25 mmol/L (ref 20–29)
Calcium: 9.3 mg/dL (ref 8.7–10.2)
Chloride: 99 mmol/L (ref 96–106)
Creatinine, Ser: 0.69 mg/dL (ref 0.57–1.00)
GFR calc Af Amer: 114 mL/min/{1.73_m2} (ref >59)
GFR calc non Af Amer: 99 mL/min/{1.73_m2} (ref >59)
Globulin, Total: 2.5 g/dL (ref 1.5–4.5)
Glucose: 92 mg/dL (ref 65–99)
Potassium: 4.4 mmol/L (ref 3.5–5.2)
Sodium: 139 mmol/L (ref 134–144)
Total Protein: 6.8 g/dL (ref 6.0–8.5)

## 2020-09-25 LAB — TSH: TSH: 1.6 u[IU]/mL (ref 0.450–4.500)

## 2020-09-25 LAB — LIPID PANEL
Chol/HDL Ratio: 5.1 ratio — ABNORMAL HIGH (ref 0.0–4.4)
Cholesterol, Total: 197 mg/dL (ref 100–199)
HDL: 39 mg/dL — ABNORMAL LOW (ref >39)
LDL Chol Calc (NIH): 113 mg/dL — ABNORMAL HIGH (ref 0–99)
Triglycerides: 262 mg/dL — ABNORMAL HIGH (ref 0–149)
VLDL Cholesterol Cal: 45 mg/dL — ABNORMAL HIGH (ref 5–40)

## 2020-09-25 LAB — HEPATITIS C ANTIBODY: Hep C Virus Ab: <0.1 s/co ratio (ref 0.0–0.9)

## 2020-10-13 ENCOUNTER — Ambulatory Visit: Payer: Commercial Managed Care - PPO | Admitting: Cardiology

## 2020-10-13 ENCOUNTER — Ambulatory Visit (INDEPENDENT_AMBULATORY_CARE_PROVIDER_SITE_OTHER): Payer: Commercial Managed Care - PPO

## 2020-10-13 ENCOUNTER — Encounter: Payer: Self-pay | Admitting: *Deleted

## 2020-10-13 ENCOUNTER — Other Ambulatory Visit: Payer: Self-pay | Admitting: Cardiology

## 2020-10-13 ENCOUNTER — Other Ambulatory Visit: Payer: Self-pay

## 2020-10-13 ENCOUNTER — Encounter: Payer: Self-pay | Admitting: Cardiology

## 2020-10-13 VITALS — BP 138/86 | HR 94 | Ht 67.0 in | Wt 194.0 lb

## 2020-10-13 DIAGNOSIS — R002 Palpitations: Secondary | ICD-10-CM

## 2020-10-13 DIAGNOSIS — E782 Mixed hyperlipidemia: Secondary | ICD-10-CM

## 2020-10-13 DIAGNOSIS — Z8249 Family history of ischemic heart disease and other diseases of the circulatory system: Secondary | ICD-10-CM

## 2020-10-13 DIAGNOSIS — R079 Chest pain, unspecified: Secondary | ICD-10-CM | POA: Diagnosis not present

## 2020-10-13 NOTE — Patient Instructions (Signed)
Medication Instructions:  Your physician recommends that you continue on your current medications as directed. Please refer to the Current Medication list given to you today.   *If you need a refill on your cardiac medications before your next appointment, please call your pharmacy*   Lab Work: NONE   If you have labs (blood work) drawn today and your tests are completely normal, you will receive your results only by: Marland Kitchen MyChart Message (if you have MyChart) OR . A paper copy in the mail If you have any lab test that is abnormal or we need to change your treatment, we will call you to review the results.   Testing/Procedures: Your physician has requested that you have en exercise stress myoview. For further information please visit [email or web address]. Please follow instruction sheet, as given.  ZIO XT- Long Term Monitor Instructions   Your physician has requested you wear your ZIO patch monitor___14____days.   This is a single patch monitor.  [employer] supplies one patch monitor per enrollment.  Additional stickers are not available.   Please do not apply patch if you will be having a Nuclear Stress Test, Echocardiogram, Cardiac CT, MRI, or Chest Xray during the time frame you would be wearing the monitor. The patch cannot be worn during these tests.  You cannot remove and re-apply the ZIO XT patch monitor.   Your ZIO patch monitor will be sent USPS Priority mail from [facility] directly to your home address. The monitor may also be mailed to a PO BOX if home delivery is not available.   It may take 3-5 days to receive your monitor after you have been enrolled.   Once you have received you monitor, please review enclosed instructions.  Your monitor has already been registered assigning a specific monitor serial # to you.   Applying the monitor   Shave hair from upper left chest.   Hold abrader disc by orange tab.  Rub abrader in 40 strokes over left upper chest as indicated  in your monitor instructions.   Clean area with 4 enclosed alcohol pads .  Use all pads to assure are is cleaned thoroughly.  Let dry.   Apply patch as indicated in monitor instructions.  Patch will be place under collarbone on left side of chest with arrow pointing upward.   Rub patch adhesive wings for 2 minutes.Remove white label marked "1".  Remove white label marked "2".  Rub patch adhesive wings for 2 additional minutes.   While looking in a mirror, press and release button in center of patch.  A small green light will flash 3-4 times .  This will be your only indicator the monitor has been turned on.     Do not shower for the first 24 hours.  You may shower after the first 24 hours.   Press button if you feel a symptom. You will hear a small click.  Record Date, Time and Symptom in the Patient Log Book.   When you are ready to remove patch, follow instructions on last 2 pages of Patient Log Book.  Stick patch monitor onto last page of Patient Log Book.   Place Patient Log Book in [place] box.  Use locking tab on box and tape box closed securely.  The [employer] has [employer] on it.  Please place in mailbox as soon as possible.  Your physician should have your test results approximately 7 days after the monitor has been mailed back to [facility]  Call [place] at [phone] if you have questions regarding your ZIO XT patch monitor.  Call them immediately if you see an orange light blinking on your monitor.   If your monitor falls off in less than 4 days contact our Monitor department at [phone].  If your monitor becomes loose or falls off after 4 days call [employer] at [phone] for suggestions on securing your monitor.     Follow-Up: At [facility], you and your health needs are our priority.  As part of our continuing mission to provide you with exceptional heart care, we have created designated Provider Care Teams.  These Care  Teams include your primary Cardiologist (physician) and Advanced Practice Providers (APPs -  Physician Assistants and Nurse Practitioners) who all work together to provide you with the care you need, when you need it.  We recommend signing up for the patient portal called "MyChart".  Sign up information is provided on this After Visit Summary.  MyChart is used to connect with patients for Virtual Visits (Telemedicine).  Patients are able to view lab/test results, encounter notes, upcoming appointments, etc.  Non-urgent messages can be sent to your provider as well.   To learn more about what you can do with MyChart, go to [email or web address].    Your next appointment:   3 month(s)  The format for your next appointment:   In Person  Provider:   You will see one of the following Advanced Practice Providers on your designated Care Team:    Bernerd Pho, PA-C   Ermalinda Barrios, PA-C   Other Instructions Thank you for choosing [facility]!

## 2020-10-13 NOTE — Progress Notes (Signed)
Clinical Summary Ms. Egley is a 55 y.o.female seen as new consult, referred by NP [facility] for the following medical problems.   1. Hyperlipidemia 09/2020 TC 197 TG 262 HDL 39 LDL 113 - she is on atorvastatin 20mg  daily  ASCVD risk lifetime 39%, 10 year risk 6% - she is onstatin   2. Family history of CAD -father died at 5 with heart disease  3. Chest pain - symptoms started over 2 years ago - burning midchest/epigastric into left chest. 5-6/10 in severity. Can occur at rest or with exertion. No other associated symptoms. Lasts 5-10 minutes. Occurs every 2-3 days.  - no relation to food or eating - reports some generalized fatigue.  - no significant DOE.  CAD risk factors: father MI 69, sister with stents in her later 40s/earyl 44s. Hyperlipidemia, smoker x 18    4. Palpitations - fluttering x 1 year - can occur at rest or with activity - lasts a few seconds - no other associated symptoms - sporadic, occurs once every 2 weeks  - has cut back on caffeine. Coffee infrequent, mountain dew 20 oz bottles x 2, no tea, no energy drinks. One beer nightly - symptoms not improved  Past Medical History:  Diagnosis Date  . Congestion of upper airway    sinus and chest 1 week, fever one nite  . GERD (gastroesophageal reflux disease)    occ  . Personal history of colonic polyps 10/21/2013   Laparoscopic sigmoid colectomy July 2012  Collected Date: 03/08/2011 Received Date: 03/09/2011 Physician: Aaron Edelman D. 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 - SURGICAL RESECTION MARGINS, NEGATIVE FOR DYSPLASIA OR MALIGNANCY. - SIXTEEN LYMPH NODES, NEGATIVE FOR TUMOR (0/16). - ENDOSCOPIC TATTOO PRESENT [name] Pathologist, Electronic Signature (Case signed  03/10/2011) Specimen Gross and Clinical Information Specimen(s) Obtained: Colon, segmental resection for tumor, splenic flexor with polyp   . Rectal bleeding   . Thyroid disease    hypothyroidism     Allergies  Allergen Reactions  . Oxycodone     Hallucinations     Current Outpatient Medications  Medication Sig Dispense Refill  . albuterol (VENTOLIN HFA) 108 (90 Base) MCG/ACT inhaler Inhale 2 puffs into the lungs every 6 (six) hours as needed for wheezing or shortness of breath. (Needs to be seen before next refill) 18 each 0  . aspirin EC 81 MG tablet Take 1 tablet (81 mg total) by mouth daily. 90 tablet 1  . atorvastatin (LIPITOR) 20 MG tablet Take 1 tablet (20 mg total) by mouth daily. 90 tablet 1  . buPROPion (WELLBUTRIN SR) 150 MG 12 hr tablet Take 1 tablet (150 mg total) by mouth 2 (two) times daily. 180 tablet 1  . Cholecalciferol (VITAMIN D) 2000 units CAPS Take by mouth.    . escitalopram (LEXAPRO) 20 MG tablet Take 1 tablet (20 mg total) by mouth daily. 90 tablet 1  . fluticasone (FLONASE) 50 MCG/ACT nasal spray Place 2 sprays into both nostrils daily. 16 g 6  . Krill Oil 500 MG CAPS Take by mouth.    . levothyroxine (SYNTHROID) 88 MCG tablet Take 1 tablet (88 mcg total) by mouth daily. 90 tablet 1  . Magnesium Hydroxide (MAGNESIA PO) Take by mouth.    Marland Kitchen OVER THE COUNTER MEDICATION CQ  10 daily     No current facility-administered medications for this visit.     Past Surgical History:  Procedure Laterality Date  . COLON SURGERY    . LAPAROSCOPIC SIGMOID COLECTOMY  03/08/2011  . OOPHORECTOMY Left 97   also lft tube  . TUBAL LIGATION     left fallopian tube removed  . VENTRAL HERNIA REPAIR N/A 12/03/2013   Procedure: LAPAROSCOPIC LYSIS OF ADHESIONS AND REPAIR OF INCARCERATED VENTRAL HERNIA WITH MESH;  Surgeon: Adin Hector, MD;  Location: [place];  Service: General;  Laterality: N/A;     Allergies  Allergen Reactions  . Oxycodone     Hallucinations       Family History  Problem Relation Age of Onset  . Cancer Mother        lung cancer  . Heart disease Father      Social History Ms. Whinery reports that she has been smoking cigarettes. She has a 12.00 pack-year smoking history. She has never used smokeless tobacco. Ms. Strahm reports current alcohol use.   Review of Systems CONSTITUTIONAL: No weight loss, fever, chills, weakness or fatigue.  HEENT: Eyes: No visual loss, blurred vision, double vision or yellow sclerae.No hearing loss, sneezing, congestion, runny nose or sore throat.  SKIN: No rash or itching.  CARDIOVASCULAR: per hpi RESPIRATORY: per hpi GASTROINTESTINAL: No anorexia, nausea, vomiting or diarrhea. No abdominal pain or blood.  GENITOURINARY: No burning on urination, no polyuria NEUROLOGICAL: No headache, dizziness, syncope, paralysis, ataxia, numbness or tingling in the extremities. No change in bowel or bladder control.  MUSCULOSKELETAL: No muscle, back pain, joint pain or stiffness.  LYMPHATICS: No enlarged nodes. No history of splenectomy.  PSYCHIATRIC: No history of depression or anxiety.  ENDOCRINOLOGIC: No reports of sweating, cold or heat intolerance. No polyuria or polydipsia.  Marland Kitchen   Physical Examination Today's Vitals   10/13/20 0903  BP: 138/86  Pulse: 94  SpO2: 97%  Weight: 194 lb (88 kg)  Height: 5\' 7"  (1.702 m)   Body mass index is 30.38 kg/m.  Gen: resting comfortably, no acute distress HEENT: no scleral icterus, pupils equal round and reactive, no palptable cervical adenopathy,  CV: RRR, no m/r/g, no jvd Resp: Clear to auscultation bilaterally GI: abdomen is soft, non-tender, non-distended, normal bowel sounds, no hepatosplenomegaly MSK: extremities are warm, no edema.  Skin: warm, no rash Neuro:  no focal deficits Psych: appropriate affect    Assessment and Plan  1. Palpitations - EKG today shows NSR - obtain 2 week zio patch monitor  2. Chest pain - mixed symptoms,  several CAD risk factors including strong family history - plan for exercise nuclear stress test  3. Hyperlipidemia - continue statin      Arnoldo Lenis, M.D.

## 2020-10-16 ENCOUNTER — Other Ambulatory Visit: Payer: Self-pay

## 2020-10-16 ENCOUNTER — Other Ambulatory Visit (HOSPITAL_COMMUNITY)
Admission: RE | Admit: 2020-10-16 | Discharge: 2020-10-16 | Disposition: A | Discharge status: Home / Self Care | Payer: Commercial Managed Care - PPO | Source: Ambulatory Visit | Attending: Cardiology | Admitting: Cardiology

## 2020-10-16 DIAGNOSIS — Z20822 Contact with and (suspected) exposure to covid-19: Secondary | ICD-10-CM | POA: Diagnosis not present

## 2020-10-16 DIAGNOSIS — Z01812 Encounter for preprocedural laboratory examination: Secondary | ICD-10-CM | POA: Insufficient documentation

## 2020-10-17 LAB — SARS CORONAVIRUS 2 (TAT 6-24 HRS): SARS Coronavirus 2: NEGATIVE

## 2020-10-19 ENCOUNTER — Encounter (HOSPITAL_COMMUNITY): Payer: Self-pay

## 2020-10-19 ENCOUNTER — Encounter (HOSPITAL_COMMUNITY)
Admission: RE | Admit: 2020-10-19 | Discharge: 2020-10-19 | Disposition: A | Discharge status: Home / Self Care | Payer: Commercial Managed Care - PPO | Source: Ambulatory Visit | Attending: Cardiology | Admitting: Cardiology

## 2020-10-19 ENCOUNTER — Encounter (HOSPITAL_BASED_OUTPATIENT_CLINIC_OR_DEPARTMENT_OTHER)
Admission: RE | Admit: 2020-10-19 | Discharge: 2020-10-19 | Disposition: A | Discharge status: Home / Self Care | Payer: Commercial Managed Care - PPO | Source: Ambulatory Visit | Attending: Cardiology | Admitting: Cardiology

## 2020-10-19 DIAGNOSIS — R079 Chest pain, unspecified: Secondary | ICD-10-CM | POA: Insufficient documentation

## 2020-10-19 LAB — NM MYOCAR MULTI W/SPECT W/WALL MOTION / EF
Estimated workload: 7 METS
Exercise duration (min): 5 min
Exercise duration (sec): 25 s
LV dias vol: 75 mL (ref 46–106)
LV sys vol: 22 mL
MPHR: 166 {beats}/min
Peak HR: 160 {beats}/min
Percent HR: 96 %
RATE: 0.32
RPE: 18
Rest HR: 75 {beats}/min
SDS: 0
SRS: 0
SSS: 0
TID: 1.14

## 2020-10-19 MED ORDER — SODIUM CHLORIDE FLUSH 0.9 % IV SOLN
INTRAVENOUS | Status: AC
  Administered 2020-10-19: 10 mL via INTRAVENOUS
  Filled 2020-10-19: qty 10

## 2020-10-19 MED ORDER — TECHNETIUM TC 99M TETROFOSMIN IV KIT
10.0000 | PACK | Freq: Once | INTRAVENOUS | Status: AC | PRN
  Administered 2020-10-19: 11 via INTRAVENOUS

## 2020-10-19 MED ORDER — REGADENOSON 0.4 MG/5ML IV SOLN
INTRAVENOUS | Status: AC
  Filled 2020-10-19: qty 5

## 2020-10-19 MED ORDER — TECHNETIUM TC 99M TETROFOSMIN IV KIT
30.0000 | PACK | Freq: Once | INTRAVENOUS | Status: AC | PRN
  Administered 2020-10-19: 31.3 via INTRAVENOUS

## 2020-10-21 ENCOUNTER — Telehealth: Payer: Self-pay | Admitting: *Deleted

## 2020-10-21 NOTE — Telephone Encounter (Signed)
Pt voiced understanding

## 2020-10-21 NOTE — Telephone Encounter (Signed)
-----   Message from Arnoldo Lenis, MD sent at 10/20/2020  8:09 AM EDT ----- Normal stress test, no signs of any blockages in the heart   Zandra Abts MD

## 2020-12-07 ENCOUNTER — Encounter: Payer: Self-pay | Admitting: Family Medicine

## 2020-12-28 ENCOUNTER — Ambulatory Visit: Payer: Commercial Managed Care - PPO | Admitting: Dermatology

## 2020-12-28 ENCOUNTER — Other Ambulatory Visit: Payer: Self-pay

## 2020-12-28 ENCOUNTER — Other Ambulatory Visit: Payer: Self-pay | Admitting: Dermatology

## 2020-12-28 DIAGNOSIS — Z1283 Encounter for screening for malignant neoplasm of skin: Secondary | ICD-10-CM | POA: Diagnosis not present

## 2020-12-28 DIAGNOSIS — D485 Neoplasm of uncertain behavior of skin: Secondary | ICD-10-CM

## 2020-12-28 DIAGNOSIS — L820 Inflamed seborrheic keratosis: Secondary | ICD-10-CM | POA: Diagnosis not present

## 2020-12-28 DIAGNOSIS — L700 Acne vulgaris: Secondary | ICD-10-CM | POA: Diagnosis not present

## 2020-12-28 MED ORDER — AMZEEQ 4 % EX FOAM: 1.0000 "application " | Freq: Every day | CUTANEOUS | 3 refills | Status: DC

## 2020-12-28 NOTE — Patient Instructions (Addendum)

## 2020-12-30 ENCOUNTER — Other Ambulatory Visit: Payer: Self-pay

## 2020-12-30 ENCOUNTER — Ambulatory Visit: Payer: Commercial Managed Care - PPO | Admitting: Nurse Practitioner

## 2020-12-30 ENCOUNTER — Encounter: Payer: Self-pay | Admitting: Nurse Practitioner

## 2020-12-30 VITALS — BP 136/82 | HR 94 | Temp 97.4°F | Wt 198.0 lb

## 2020-12-30 DIAGNOSIS — J039 Acute tonsillitis, unspecified: Secondary | ICD-10-CM | POA: Diagnosis not present

## 2020-12-30 DIAGNOSIS — R0989 Other specified symptoms and signs involving the circulatory and respiratory systems: Secondary | ICD-10-CM

## 2020-12-30 LAB — CULTURE, GROUP A STREP

## 2020-12-30 LAB — RAPID STREP SCREEN (MED CTR MEBANE ONLY): Strep Gp A Ag, IA W/Reflex: NEGATIVE

## 2020-12-30 MED ORDER — AMOXICILLIN 500 MG PO CAPS: 500.0000 mg | ORAL_CAPSULE | Freq: Two times a day (BID) | ORAL | 0 refills | Status: AC

## 2020-12-30 NOTE — Assessment & Plan Note (Signed)
Symptoms of tonsillitis with sore throat and pain not well controlled in the last 3 to 4 days.  On assessment patient's tonsils are swollen, and erythematous.  No nausea, fever or malaise.  Strep throat test negative. Advised patient to gargle with salt water, Chloraseptic spray.  Amoxicillin 500 mg tablet twice daily for 10 days.  Increase hydration.  Follow-up with worsening or unresolved symptoms.  Rx sent to pharmacy.

## 2020-12-30 NOTE — Patient Instructions (Signed)

## 2020-12-30 NOTE — Progress Notes (Signed)
Acute Office Visit  Subjective:    Patient ID: Kendra Fuller, female    DOB: 04/27/1966, 55 y.o.   MRN: [id number]  Chief Complaint  Patient presents with  . Sore Throat    Sore Throat  This is a new problem. Episode onset: In the past 2 to 3 days. The problem has been gradually worsening. The pain is worse on the right side. There has been no fever. The pain is moderate. Associated symptoms include a hoarse voice, neck pain, swollen glands and trouble swallowing. Pertinent negatives include no abdominal pain, congestion, coughing, diarrhea, ear pain, headaches, shortness of breath or vomiting. She has tried NSAIDs for the symptoms. The treatment provided mild relief.     Past Medical History:  Diagnosis Date  . Congestion of upper airway    sinus and chest 1 week, fever one nite  . GERD (gastroesophageal reflux disease)    occ  . Personal history of colonic polyps 10/21/2013   Laparoscopic sigmoid colectomy July 2012  Collected Date: 03/08/2011 Received Date: 03/09/2011 Physician: Aaron Edelman D. 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 - SURGICAL RESECTION MARGINS, NEGATIVE FOR DYSPLASIA OR MALIGNANCY. - SIXTEEN LYMPH NODES, NEGATIVE FOR TUMOR (0/16). - ENDOSCOPIC TATTOO PRESENT [name] Pathologist, Electronic Signature (Case signed 03/10/2011) Specimen Gross and Clinical Information Specimen(s) Obtained: Colon, segmental resection for tumor, splenic flexor with polyp   . Rectal bleeding   . Thyroid disease    hypothyroidism    Past Surgical History:  Procedure Laterality Date  . COLON SURGERY    . LAPAROSCOPIC SIGMOID COLECTOMY  03/08/2011  . OOPHORECTOMY Left 97   also lft tube  . TUBAL LIGATION     left fallopian tube removed  . VENTRAL  HERNIA REPAIR N/A 12/03/2013   Procedure: LAPAROSCOPIC LYSIS OF ADHESIONS AND REPAIR OF INCARCERATED VENTRAL HERNIA WITH MESH;  Surgeon: Adin Hector, MD;  Location: [place];  Service: General;  Laterality: N/A;    Family History  Problem Relation Age of Onset  . Cancer Mother        lung cancer  . Heart disease Father     Social History   Socioeconomic History  . Marital status: Single    Spouse name: Not on file  . Number of children: Not on file  . Years of education: Not on file  . Highest education level: Not on file  Occupational History  . Not on file  Tobacco Use  . Smoking status: Current Every Day Smoker    Packs/day: 1.00    Years: 12.00    Pack years: 12.00    Types: Cigarettes  . Smokeless tobacco: Never Used  Substance and Sexual Activity  . Alcohol use: Yes    Comment: occ  . Drug use: No  . Sexual activity: Not on file  Other Topics Concern  . Not on file  Social History Narrative  . Not on file   Social Determinants of Health   Financial Resource Strain: Not on file  Food Insecurity: Not on file  Transportation Needs: Not on file  Physical Activity: Not on file  Stress: Not on file  Social Connections: Not on file  Intimate Partner Violence: Not on file    Outpatient Medications Prior to Visit  Medication Sig Dispense  Refill  . albuterol (VENTOLIN HFA) 108 (90 Base) MCG/ACT inhaler Inhale 2 puffs into the lungs every 6 (six) hours as needed for wheezing or shortness of breath. (Needs to be seen before next refill) 18 each 0  . aspirin EC 81 MG tablet Take 1 tablet (81 mg total) by mouth daily. 90 tablet 1  . atorvastatin (LIPITOR) 20 MG tablet Take 1 tablet (20 mg total) by mouth daily. 90 tablet 1  . buPROPion (WELLBUTRIN SR) 150 MG 12 hr tablet Take 1 tablet (150 mg total) by mouth 2 (two) times daily. 180 tablet 1  . Cholecalciferol (VITAMIN D) 2000 units CAPS Take by mouth.    . escitalopram (LEXAPRO) 20 MG tablet Take 1 tablet (20 mg  total) by mouth daily. 90 tablet 1  . fluticasone (FLONASE) 50 MCG/ACT nasal spray Place 2 sprays into both nostrils daily. 16 g 6  . Krill Oil 500 MG CAPS Take by mouth.    . levothyroxine (SYNTHROID) 88 MCG tablet Take 1 tablet (88 mcg total) by mouth daily. 90 tablet 1  . Magnesium Hydroxide (MAGNESIA PO) Take by mouth.    . Minocycline HCl Micronized (AMZEEQ) 4 % FOAM Apply 1 application topically daily. 30 g 3  . OVER THE COUNTER MEDICATION CQ 10 daily     No facility-administered medications prior to visit.    Allergies  Allergen Reactions  . Oxycodone     Hallucinations    Review of Systems  Constitutional: Negative.   HENT: Positive for hoarse voice and trouble swallowing. Negative for congestion and ear pain.   Eyes: Negative.   Respiratory: Negative for cough and shortness of breath.   Gastrointestinal: Negative for abdominal pain, diarrhea and vomiting.  Musculoskeletal: Positive for neck pain.  Neurological: Negative for headaches.  All other systems reviewed and are negative.      Objective:    Physical Exam Vitals and nursing note reviewed.  HENT:     Head: Normocephalic.     Right Ear: External ear normal. No swelling. There is no impacted cerumen.     Left Ear: External ear normal. There is no impacted cerumen.     Mouth/Throat:     Mouth: Mucous membranes are moist.     Pharynx: Pharyngeal swelling, posterior oropharyngeal erythema and uvula swelling present.  Cardiovascular:     Rate and Rhythm: Normal rate and regular rhythm.     Pulses: Normal pulses.     Heart sounds: Normal heart sounds.  Pulmonary:     Effort: Pulmonary effort is normal.     Breath sounds: Normal breath sounds.  Abdominal:     General: Bowel sounds are normal.  Skin:    Findings: No rash.  Neurological:     Mental Status: She is alert and oriented to person, place, and time.     BP 136/82   Pulse 94   Temp (!) 97.4 F (36.3 C) (Temporal)   Wt 198 lb (89.8 kg)   LMP  10/07/2014   SpO2 96%   BMI 31.01 kg/m  Wt Readings from Last 3 Encounters:  12/30/20 198 lb (89.8 kg)  10/13/20 194 lb (88 kg)  09/24/20 198 lb 9.6 oz (90.1 kg)    Health Maintenance Due  Topic Date Due  . COVID-19 Vaccine (1) Never done  . PAP SMEAR-Modifier  07/24/2015    There are no preventive care reminders to display for this patient.   Lab Results  Component Value Date   TSH 1.600 09/24/2020  Lab Results  Component Value Date   WBC 9.9 09/24/2020   HGB 14.4 09/24/2020   HCT 42.7 09/24/2020   MCV 95 09/24/2020   PLT 221 09/24/2020   Lab Results  Component Value Date   NA 139 09/24/2020   K 4.4 09/24/2020   CO2 25 09/24/2020   GLUCOSE 92 09/24/2020   BUN 7 09/24/2020   CREATININE 0.69 09/24/2020   BILITOT 0.4 09/24/2020   ALKPHOS 86 09/24/2020   AST 32 09/24/2020   ALT 38 (H) 09/24/2020   PROT 6.8 09/24/2020   ALBUMIN 4.3 09/24/2020   CALCIUM 9.3 09/24/2020   Lab Results  Component Value Date   CHOL 197 09/24/2020   Lab Results  Component Value Date   HDL 39 (L) 09/24/2020   Lab Results  Component Value Date   LDLCALC 113 (H) 09/24/2020   Lab Results  Component Value Date   TRIG 262 (H) 09/24/2020   Lab Results  Component Value Date   CHOLHDL 5.1 (H) 09/24/2020   No results found for: HGBA1C     Assessment & Plan:   Problem List Items Addressed This Visit      Respiratory   Tonsillitis    Symptoms of tonsillitis with sore throat and pain not well controlled in the last 3 to 4 days.  On assessment patient's tonsils are swollen, and erythematous.  No nausea, fever or malaise.  Strep throat test negative. Advised patient to gargle with salt water, Chloraseptic spray.  Amoxicillin 500 mg tablet twice daily for 10 days.  Increase hydration.  Follow-up with worsening or unresolved symptoms.  Rx sent to pharmacy.      Relevant Medications   amoxicillin (AMOXIL) 500 MG capsule    Other Visit Diagnoses    Tonsil pain    -  Primary    Relevant Orders   Rapid Strep Screen ([facility] ONLY)       Meds ordered this encounter  Medications  . amoxicillin (AMOXIL) 500 MG capsule    Sig: Take 1 capsule (500 mg total) by mouth 2 (two) times daily for 10 days.    Dispense:  20 capsule    Refill:  0    Order Specific Question:   Supervising Provider    Answer:   Janora Norlander [[id number]]     Ivy Lynn, NP

## 2020-12-31 NOTE — Addendum Note (Signed)
Addended by: Ivy Lynn on: 12/31/2020 08:16 PM   Modules accepted: Level of Service

## 2021-01-05 ENCOUNTER — Telehealth: Payer: Self-pay | Admitting: *Deleted

## 2021-01-05 NOTE — Telephone Encounter (Signed)
-----   Message from Arnoldo Lenis, MD sent at 01/05/2021 12:47 PM EDT ----- Isolated episode an abnormal rhythm called SVT. This is not a dangerous rhythm and can be common in young otherwise healthy people, and can cause palpitations. It does not have to be treated unless ongoing symptoms, in which case we often try medications like metoprolol or diltiazem. How are her symptoms doing  Zandra Abts MD

## 2021-01-06 NOTE — Telephone Encounter (Signed)
Pt voiced understanding - says she did have an episode this weekend lasting around 35-40 seconds - would like to try metoprolol or dilt to have as needed when she has these episodes

## 2021-01-07 NOTE — Telephone Encounter (Signed)
Can have do lopressor 25mg  every 3 hours prn palpitations   J Mervil Wacker MD

## 2021-01-08 MED ORDER — METOPROLOL TARTRATE 25 MG PO TABS: ORAL_TABLET | ORAL | 1 refills | Status: AC

## 2021-01-08 NOTE — Telephone Encounter (Signed)
Pt voiced understanding - Medication sent to pharmacy ° °

## 2021-01-11 ENCOUNTER — Encounter: Payer: Self-pay | Admitting: Dermatology

## 2021-01-11 NOTE — Progress Notes (Signed)
   New Patient   Subjective  Kendra Fuller is a 55 y.o. female who presents for the following: Skin Problem (Patient has a spot on her left arm. Patient noticed it changing color and shape. Its been there x 4 years. Patient also is having breakouts on her face. Wants something for her acne. ).  New growth on left arm, acne. Location:  Duration:  Quality:  Associated Signs/Symptoms: Modifying Factors:  Severity:  Timing: Context:    The following portions of the chart were reviewed this encounter and updated as appropriate:  Tobacco  Allergies  Meds  Problems  Med Hx  Surg Hx  Fam Hx      Objective  Well appearing patient in no apparent distress; mood and affect are within normal limits. Objective  Head - Anterior (Face): Mostly superficial inflammatory papules  Objective  Left Forearm - Posterior: Dusky pink 6 mm crust     Objective  Mid Back: Sun exposed areas and back: No atypical pigmented lesions.    All sun exposed areas plus back examined.   Assessment & Plan  Acne vulgaris Head - Anterior (Face)  If out-of-pocket expense is reasonable, we will try Amzeeq applied to areas prone to breakout and nightly for the next 6 to 8 weeks 7.  Follow-up by MyChart or phone at that time.  Minocycline HCl Micronized (AMZEEQ) 4 % FOAM - Head - Anterior (Face)  Neoplasm of uncertain behavior of skin Left Forearm - Posterior  Skin / nail biopsy Type of biopsy: tangential   Informed consent: discussed and consent obtained   Timeout: patient name, date of birth, surgical site, and procedure verified   Anesthesia: the lesion was anesthetized in a standard fashion   Anesthetic:  1% lidocaine w/ epinephrine 1-100,000 local infiltration Instrument used: flexible razor blade   Hemostasis achieved with: ferric subsulfate   Outcome: patient tolerated procedure well   Post-procedure details: wound care instructions given    Specimen 1 - Surgical  pathology Differential Diagnosis: sk  Check Margins: No  Encounter for screening for malignant neoplasm of skin Mid Back  Annual skin examination, self examine twice annually.  Continue ultraviolet protection.

## 2021-01-13 ENCOUNTER — Ambulatory Visit: Payer: Commercial Managed Care - PPO | Admitting: Student

## 2021-01-13 ENCOUNTER — Other Ambulatory Visit: Payer: Self-pay

## 2021-01-13 ENCOUNTER — Encounter: Payer: Self-pay | Admitting: Student

## 2021-01-13 VITALS — BP 130/80 | HR 101 | Ht 67.0 in | Wt 200.0 lb

## 2021-01-13 DIAGNOSIS — R002 Palpitations: Secondary | ICD-10-CM | POA: Diagnosis not present

## 2021-01-13 DIAGNOSIS — E782 Mixed hyperlipidemia: Secondary | ICD-10-CM

## 2021-01-13 DIAGNOSIS — I471 Supraventricular tachycardia: Secondary | ICD-10-CM | POA: Diagnosis not present

## 2021-01-13 DIAGNOSIS — Z720 Tobacco use: Secondary | ICD-10-CM

## 2021-01-13 DIAGNOSIS — R079 Chest pain, unspecified: Secondary | ICD-10-CM | POA: Diagnosis not present

## 2021-01-13 NOTE — Progress Notes (Signed)
Cardiology Office Note    Date:  01/13/2021   ID:  Kendra Fuller, DOB [birth year], MRN [id number]  PCP:  Sharion Balloon, FNP  Cardiologist: Carlyle Dolly, MD    Chief Complaint  Patient presents with  . Follow-up    3 month visit    History of Present Illness:    Kendra Fuller is a 55 y.o. female with past medical history of chest pain, HLD, palpitations and family history of CAD who presents to the office today for 56-month follow-up.  She was last examined by Dr. Harl Bowie in 10/2020 as a new patient referral for chest pain and palpitations. She reported having intermittent chest pain over the past 2 years which could occur at rest or with activity. She also reported intermittent palpitations over the past year which could last for seconds at a time and spontaneously resolve. She had recently reduced her caffeine intake. An Exercise Myoview along with 2-week Zio patch were recommended for further evaluation. Her stress test showed no evidence of ischemia or infarction and was overall a low risk study. Her event monitor showed an isolated episode of SVT lasting for less than 10 seconds but no significant arrhythmias. She was provided with an Rx for Lopressor 25 mg to take as needed.  In talking with the patient today, she reports overall doing well since her last office visit. She denies any recurrent episodes of chest pain. She has experienced intermittent palpitations which typically last for seconds and spontaneously resolve. Denies any associated symptoms of dizziness or  dyspnea. No recent orthopnea, PND or pitting edema. She has not yet started Lopressor as she was concerned about only taking this intermittently as a video provided by her pharmacy said to not discontinue abruptly.  She has reduced her caffeine intake to less than 2 sodas daily. Typically consumes 2 beers on a nightly basis.    Past Medical History:  Diagnosis Date  . Congestion of upper airway    sinus  and chest 1 week, fever one nite  . GERD (gastroesophageal reflux disease)    occ  . Personal history of colonic polyps 10/21/2013   Laparoscopic sigmoid colectomy July 2012  Collected Date: 03/08/2011 Received Date: 03/09/2011 Physician: Aaron Edelman D. 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 - SURGICAL RESECTION MARGINS, NEGATIVE FOR DYSPLASIA OR MALIGNANCY. - SIXTEEN LYMPH NODES, NEGATIVE FOR TUMOR (0/16). - ENDOSCOPIC TATTOO PRESENT [name] Pathologist, Electronic Signature (Case signed 03/10/2011) Specimen Gross and Clinical Information Specimen(s) Obtained: Colon, segmental resection for tumor, splenic flexor with polyp   . Rectal bleeding   . Thyroid disease    hypothyroidism    Past Surgical History:  Procedure Laterality Date  . COLON SURGERY    . LAPAROSCOPIC SIGMOID COLECTOMY  03/08/2011  . OOPHORECTOMY Left 97   also lft tube  . TUBAL LIGATION     left fallopian tube removed  . VENTRAL HERNIA REPAIR N/A 12/03/2013   Procedure: LAPAROSCOPIC LYSIS OF ADHESIONS AND REPAIR OF INCARCERATED VENTRAL HERNIA WITH MESH;  Surgeon: Adin Hector, MD;  Location: [facility];  Service: General;  Laterality: N/A;    Current Medications: Outpatient Medications Prior to Visit  Medication Sig Dispense Refill  . albuterol (VENTOLIN HFA) 108 (90 Base) MCG/ACT inhaler Inhale 2 puffs into  the lungs every 6 (six) hours as needed for wheezing or shortness of breath. (Needs to be seen before next refill) 18 each 0  . aspirin EC 81 MG tablet Take 1 tablet (81 mg total) by mouth daily. 90 tablet 1  . atorvastatin (LIPITOR) 20 MG tablet Take 1 tablet (20 mg total) by mouth daily. 90 tablet 1  . buPROPion (WELLBUTRIN SR) 150 MG 12 hr tablet Take 1 tablet (150 mg total) by mouth 2  (two) times daily. 180 tablet 1  . Cholecalciferol (VITAMIN D) 2000 units CAPS Take by mouth.    . escitalopram (LEXAPRO) 20 MG tablet Take 1 tablet (20 mg total) by mouth daily. 90 tablet 1  . fluticasone (FLONASE) 50 MCG/ACT nasal spray Place 2 sprays into both nostrils daily. 16 g 6  . Krill Oil 500 MG CAPS Take by mouth.    . levothyroxine (SYNTHROID) 88 MCG tablet Take 1 tablet (88 mcg total) by mouth daily. 90 tablet 1  . Magnesium Hydroxide (MAGNESIA PO) Take by mouth.    . metoprolol tartrate (LOPRESSOR) 25 MG tablet TAKE 1 TABLET EVERY 3 HOURS AS NEEDED FOR PALPITATIONS 180 tablet 1  . Minocycline HCl Micronized (AMZEEQ) 4 % FOAM Apply 1 application topically daily. 30 g 3  . OVER THE COUNTER MEDICATION CQ 10 daily     No facility-administered medications prior to visit.     Allergies:   Oxycodone   Social History   Socioeconomic History  . Marital status: Single    Spouse name: Not on file  . Number of children: Not on file  . Years of education: Not on file  . Highest education level: Not on file  Occupational History  . Not on file  Tobacco Use  . Smoking status: Current Every Day Smoker    Packs/day: 1.00    Years: 12.00    Pack years: 12.00    Types: Cigarettes  . Smokeless tobacco: Never Used  Substance and Sexual Activity  . Alcohol use: Yes    Comment: occ  . Drug use: No  . Sexual activity: Not on file  Other Topics Concern  . Not on file  Social History Narrative  . Not on file   Social Determinants of Health   Financial Resource Strain: Not on file  Food Insecurity: Not on file  Transportation Needs: Not on file  Physical Activity: Not on file  Stress: Not on file  Social Connections: Not on file     Family History:  The patient's family history includes Cancer in her mother; Heart disease in her father.   Review of Systems:    Please see the history of present illness.     All other systems reviewed and are otherwise negative except as  noted above.   Physical Exam:    VS:  BP 130/80 (BP Location: Left Arm, Patient Position: Sitting, Cuff Size: Normal)   Pulse (!) 101   Ht 5\' 7"  (1.702 m)   Wt 200 lb (90.7 kg)   LMP 10/07/2014   SpO2 98%   BMI 31.32 kg/m    General: Well developed, well nourished,female appearing in no acute distress. Head: Normocephalic, atraumatic. Neck: No carotid bruits. JVD not elevated.  Lungs: Respirations regular and unlabored, without wheezes or rales.  Heart: Regular rate and rhythm. No S3 or S4.  No murmur, no rubs, or gallops appreciated. Abdomen: Appears non-distended. No obvious abdominal masses. Msk:  Strength and tone appear normal for age. No obvious joint  deformities or effusions. Extremities: No clubbing or cyanosis. No pitting edema.  Distal pedal pulses are 2+ bilaterally. Neuro: Alert and oriented X 3. Moves all extremities spontaneously. No focal deficits noted. Psych:  Responds to questions appropriately with a normal affect. Skin: No rashes or lesions noted  Wt Readings from Last 3 Encounters:  01/13/21 200 lb (90.7 kg)  12/30/20 198 lb (89.8 kg)  10/13/20 194 lb (88 kg)     Studies/Labs Reviewed:   EKG:  EKG is not ordered today.    Recent Labs: 09/24/2020: ALT 38; BUN 7; Creatinine, Ser 0.69; Hemoglobin 14.4; Platelets 221; Potassium 4.4; Sodium 139; TSH 1.600   Lipid Panel    Component Value Date/Time   CHOL 197 09/24/2020 1116   TRIG 262 (H) 09/24/2020 1116   HDL 39 (L) 09/24/2020 1116   CHOLHDL 5.1 (H) 09/24/2020 1116   LDLCALC 113 (H) 09/24/2020 1116    Additional studies/ records that were reviewed today include:   NST: 10/2020  Extensive motion on EKGS during exercise but no definite ST changes to suggest ischemia  Clinically and electrically negative for ischemia  Hypertensive response to exercise  Myovue with normal perfusion No ischemia or scar  Nuclear stress EF: 71%.  Low risk study  Event Monitor: 11/05/2020  9 day  monitor  Rare supraventricular ectopy. Isolated episode of SVT lasting 10 seconds  Rare ventricular ectopy  No symptoms reported    Patch Wear Time:  9 days and 10 hours (2022-03-08T09:50:02-499 to 2022-03-17T21:29:33-0400)  Patient had a min HR of 64 bpm, max HR of 160 bpm, and avg HR of 90 bpm. Predominant underlying rhythm was Sinus Rhythm. 1 run of Supraventricular Tachycardia occurred lasting 10.4 secs with a max rate of 160 bpm (avg 134 bpm). Isolated SVEs were rare  (<1.0%), SVE Couplets were rare (<1.0%), and SVE Triplets were rare (<1.0%). Isolated VEs were rare (<1.0%), and no VE Couplets or VE Triplets were present.     Assessment:    1. Palpitations   2. SVT (supraventricular tachycardia) (HCC)   3. Chest pain, unspecified type   4. Mixed hyperlipidemia   5. Tobacco abuse      Plan:   In order of problems listed above:  1. Palpitations/SVT - Her recent monitor did show an isolated episode of SVT and her intermittent palpitations seem consistent with this or PAC's/PVC's. She was encouraged to start Lopressor 25 mg PRN and to reach out if having take this more than 2-3 times daily as it could be transitioned to Toprol-XL for sustained release. She has reduced her caffeine intake and continued reduction in this was advised. Also encouraged her to try to gradually reduce her alcohol intake to see if this helps with her palpitations as well.  2. Chest Pain - Recent stress test showed no evidence of ischemia or infarction and was overall a low-risk study. Continue with risk factor modification. She does remain on ASA and statin therapy.   3. HLD - Followed by her PCP. FLP in 09/2020 showed her LDL was elevated to 113. If LDL remains elevated, would recommend titration of Atorvastatin from 20mg  daily to 40mg  daily.   4. Tobacco Use - She has reduced her tobacco use to less than 1 ppd and was congratulated on her reduction with cessation advised.   Medication  Adjustments/Labs and Tests Ordered: Current medicines are reviewed at length with the patient today.  Concerns regarding medicines are outlined above.  Medication changes, Labs and Tests ordered today are  listed in the Patient Instructions below. Patient Instructions   Medication Instructions:  Your physician recommends that you continue on your current medications as directed. Please refer to the Current Medication list given to you today.  *If you need a refill on your cardiac medications before your next appointment, please call your pharmacy*   Lab Work: None If you have labs (blood work) drawn today and your tests are completely normal, you will receive your results only by: Marland Kitchen MyChart Message (if you have MyChart) OR . A paper copy in the mail If you have any lab test that is abnormal or we need to change your treatment, we will call you to review the results.   Testing/Procedures: None   Follow-Up: At [facility], you and your health needs are our priority.  As part of our continuing mission to provide you with exceptional heart care, we have created designated Provider Care Teams.  These Care Teams include your primary Cardiologist (physician) and Advanced Practice Providers (APPs -  Physician Assistants and Nurse Practitioners) who all work together to provide you with the care you need, when you need it.  We recommend signing up for the patient portal called "MyChart".  Sign up information is provided on this After Visit Summary.  MyChart is used to connect with patients for Virtual Visits (Telemedicine).  Patients are able to view lab/test results, encounter notes, upcoming appointments, etc.  Non-urgent messages can be sent to your provider as well.   To learn more about what you can do with MyChart, go to [email or web address].    Your next appointment:   6 month(s)  The format for your next appointment:   In Person  Provider:   You may see Carlyle Dolly, MD or  one of the following Advanced Practice Providers on your designated Care Team:    Bernerd Pho, [place]      Other Instructions    Supraventricular Tachycardia, Adult Supraventricular tachycardia (SVT) is a kind of abnormal heartbeat. It makes your heart beat very fast. This may last for a short time and then return to normal, or it may last longer. A normal resting heartbeat is 60-100 times a minute. This condition can make your heart beat more than 150 times a minute. Times of having a fast heartbeat (episodes) can be scary, but they are usually not dangerous. In some cases, they may lead to heart failure if they:  Happen many times a day.  Last longer than a few seconds. What are the causes? This condition happens when electrical signals are sent out from areas of the heart that do not normally send signals for the heartbeat.   What increases the risk? You are more likely to develop this condition if you are:  Middle aged or younger.  Female. The following factors may also make you more likely to develop this condition:  Stress.  Feeling worried or nervous (anxiety).  Tiredness.  Smoking.  Stimulant drugs, such as cocaine and methamphetamine.  Alcohol.  Caffeine.  Pregnancy.  Having certain medical conditions. What are the signs or symptoms?  A pounding heart.  A feeling that your heart is skipping beats (palpitations).  Weakness.  Trouble getting enough air.  Pain or tightness in your chest.  Dizziness or feeling like you are going to pass out (faint).  Feeling worried or nervous.  Sweating.  Feeling like you may vomit (nausea).  Passing out.  Tiredness. Sometimes, there are no symptoms. How is this treated? Treatment may include:  Vagal nerve stimulation. Ways to do this include: ? Holding your breath and pushing, as though you are pooping (having a bowel movement). ? Massaging an area on one side of your neck. Do not try this yourself.  Only a doctor should do this. If done the wrong way, it can lead to a stroke. ? Bending forward with your head between your legs. ? Coughing while bending forward with your head between your legs. ? Putting an ice-cold, wet towel on your face.  Medicines that prevent attacks.  Medicine to stop an attack given through an IV tube at the hospital.  A small electric shock (cardioversion) that stops an attack.  A procedure to get rid of cells in the area that is causing the fast heartbeats (radiofrequency ablation). If you do not have symptoms, you may not need treatment. Follow these instructions at home: Stress  Avoid things that make you feel stressed.  To deal with stress, try: ? Doing yoga or meditation. ? Being out in nature. ? Listening to relaxing music. ? Doing deep breathing. ? Taking steps to be healthy, such as getting lots of sleep, exercising, and eating a balanced diet. ? Talking with a mental health doctor. Lifestyle  Try to get at least 7 hours of sleep each night.  Do not smoke or use any products that contain nicotine or tobacco. If you need help quitting, ask your doctor.  Do not drink alcohol if it gives you a fast heartbeat.  If alcohol does not seem to give you a fast heartbeat, limit your alcohol use. If you drink alcohol: ? Limit how much you have to:  0-1 drink a day for women who are not pregnant.  0-2 drinks a day for men. ? Know how much alcohol is in your drink. In the [place] one drink equals one 12 oz bottle of beer (355 mL), one 5 oz glass of wine (148 mL), or one 1 oz glass of hard liquor (44 mL).  Be aware of how caffeine affects you. ? If caffeine gives you a fast heartbeat, do not eat, drink, or use anything with caffeine in it. ? If caffeine does not seem to give you a fast heartbeat, limit how much caffeine you eat, drink, or use.  Do not use stimulant drugs. If you need help quitting, ask your doctor.   General instructions  Stay at a  healthy weight.  Exercise regularly. Ask your doctor about good activities for you. Try one or a mixture of these: ? 150 minutes a week of gentle exercise, like walking or yoga. ? 75 minutes a week of exercise that is very active, like running or swimming.  Do vagus nerve treatments to slow down your heartbeat as told by your doctor.  Take over-the-counter and prescription medicines only as told by your doctor.  Keep all follow-up visits. Contact a doctor if:  You have a fast heartbeat more often.  Times of having a fast heartbeat last longer than before.  Home treatments to slow down your heartbeat do not help.  You have new symptoms. Get help right away if:  You have chest pain.  Your symptoms get worse.  You have trouble breathing.  Your heart beats very fast for more than 20 minutes.  You pass out. These symptoms may be an emergency. Get medical help right away. Call your local emergency services (911 in the [place]  Do not wait to see if the symptoms will go away.  Do not drive  yourself to the hospital. Summary  SVT is a type of abnormal heartbeat.  This condition can make your heart beat more than 150 times a minute.  If you do not have symptoms, you may not need treatment. This information is not intended to replace advice given to you by your health care provider. Make sure you discuss any questions you have with your health care provider. Document Revised: 03/07/2020 Document Reviewed: 03/07/2020 Elsevier Patient Education  [address]     Signed, Erma Heritage, [place]  01/13/2021 4:51 PM    [facility] [address] [place], [place] [address] Phone: [phone] Fax: [phone]

## 2021-01-13 NOTE — Patient Instructions (Addendum)
Medication Instructions:  Your physician recommends that you continue on your current medications as directed. Please refer to the Current Medication list given to you today.  *If you need a refill on your cardiac medications before your next appointment, please call your pharmacy*   Lab Work: None If you have labs (blood work) drawn today and your tests are completely normal, you will receive your results only by: Marland Kitchen MyChart Message (if you have MyChart) OR . A paper copy in the mail If you have any lab test that is abnormal or we need to change your treatment, we will call you to review the results.   Testing/Procedures: None   Follow-Up: At [facility], you and your health needs are our priority.  As part of our continuing mission to provide you with exceptional heart care, we have created designated Provider Care Teams.  These Care Teams include your primary Cardiologist (physician) and Advanced Practice Providers (APPs -  Physician Assistants and Nurse Practitioners) who all work together to provide you with the care you need, when you need it.  We recommend signing up for the patient portal called "MyChart".  Sign up information is provided on this After Visit Summary.  MyChart is used to connect with patients for Virtual Visits (Telemedicine).  Patients are able to view lab/test results, encounter notes, upcoming appointments, etc.  Non-urgent messages can be sent to your provider as well.   To learn more about what you can do with MyChart, go to [email or web address].    Your next appointment:   6 month(s)  The format for your next appointment:   In Person  Provider:   You may see Carlyle Dolly, MD or one of the following Advanced Practice Providers on your designated Care Team:    Bernerd Pho, [place]      Other Instructions    Supraventricular Tachycardia, Adult Supraventricular tachycardia (SVT) is a kind of abnormal heartbeat. It makes your heart beat  very fast. This may last for a short time and then return to normal, or it may last longer. A normal resting heartbeat is 60-100 times a minute. This condition can make your heart beat more than 150 times a minute. Times of having a fast heartbeat (episodes) can be scary, but they are usually not dangerous. In some cases, they may lead to heart failure if they:  Happen many times a day.  Last longer than a few seconds. What are the causes? This condition happens when electrical signals are sent out from areas of the heart that do not normally send signals for the heartbeat.   What increases the risk? You are more likely to develop this condition if you are:  Middle aged or younger.  Female. The following factors may also make you more likely to develop this condition:  Stress.  Feeling worried or nervous (anxiety).  Tiredness.  Smoking.  Stimulant drugs, such as cocaine and methamphetamine.  Alcohol.  Caffeine.  Pregnancy.  Having certain medical conditions. What are the signs or symptoms?  A pounding heart.  A feeling that your heart is skipping beats (palpitations).  Weakness.  Trouble getting enough air.  Pain or tightness in your chest.  Dizziness or feeling like you are going to pass out (faint).  Feeling worried or nervous.  Sweating.  Feeling like you may vomit (nausea).  Passing out.  Tiredness. Sometimes, there are no symptoms. How is this treated? Treatment may include:  Vagal nerve stimulation. Ways to do this include: ? Holding  your breath and pushing, as though you are pooping (having a bowel movement). ? Massaging an area on one side of your neck. Do not try this yourself. Only a doctor should do this. If done the wrong way, it can lead to a stroke. ? Bending forward with your head between your legs. ? Coughing while bending forward with your head between your legs. ? Putting an ice-cold, wet towel on your face.  Medicines that prevent  attacks.  Medicine to stop an attack given through an IV tube at the hospital.  A small electric shock (cardioversion) that stops an attack.  A procedure to get rid of cells in the area that is causing the fast heartbeats (radiofrequency ablation). If you do not have symptoms, you may not need treatment. Follow these instructions at home: Stress  Avoid things that make you feel stressed.  To deal with stress, try: ? Doing yoga or meditation. ? Being out in nature. ? Listening to relaxing music. ? Doing deep breathing. ? Taking steps to be healthy, such as getting lots of sleep, exercising, and eating a balanced diet. ? Talking with a mental health doctor. Lifestyle  Try to get at least 7 hours of sleep each night.  Do not smoke or use any products that contain nicotine or tobacco. If you need help quitting, ask your doctor.  Do not drink alcohol if it gives you a fast heartbeat.  If alcohol does not seem to give you a fast heartbeat, limit your alcohol use. If you drink alcohol: ? Limit how much you have to:  0-1 drink a day for women who are not pregnant.  0-2 drinks a day for men. ? Know how much alcohol is in your drink. In the [place] one drink equals one 12 oz bottle of beer (355 mL), one 5 oz glass of wine (148 mL), or one 1 oz glass of hard liquor (44 mL).  Be aware of how caffeine affects you. ? If caffeine gives you a fast heartbeat, do not eat, drink, or use anything with caffeine in it. ? If caffeine does not seem to give you a fast heartbeat, limit how much caffeine you eat, drink, or use.  Do not use stimulant drugs. If you need help quitting, ask your doctor.   General instructions  Stay at a healthy weight.  Exercise regularly. Ask your doctor about good activities for you. Try one or a mixture of these: ? 150 minutes a week of gentle exercise, like walking or yoga. ? 75 minutes a week of exercise that is very active, like running or swimming.  Do vagus  nerve treatments to slow down your heartbeat as told by your doctor.  Take over-the-counter and prescription medicines only as told by your doctor.  Keep all follow-up visits. Contact a doctor if:  You have a fast heartbeat more often.  Times of having a fast heartbeat last longer than before.  Home treatments to slow down your heartbeat do not help.  You have new symptoms. Get help right away if:  You have chest pain.  Your symptoms get worse.  You have trouble breathing.  Your heart beats very fast for more than 20 minutes.  You pass out. These symptoms may be an emergency. Get medical help right away. Call your local emergency services (911 in the [place]  Do not wait to see if the symptoms will go away.  Do not drive yourself to the hospital. Summary  SVT is a type  of abnormal heartbeat.  This condition can make your heart beat more than 150 times a minute.  If you do not have symptoms, you may not need treatment. This information is not intended to replace advice given to you by your health care provider. Make sure you discuss any questions you have with your health care provider. Document Revised: 03/07/2020 Document Reviewed: 03/07/2020 Elsevier Patient Education  [facility].

## 2021-02-13 ENCOUNTER — Other Ambulatory Visit: Payer: Self-pay | Admitting: Family

## 2021-02-13 DIAGNOSIS — E785 Hyperlipidemia, unspecified: Secondary | ICD-10-CM

## 2021-02-15 ENCOUNTER — Other Ambulatory Visit: Payer: Self-pay | Admitting: Family

## 2021-02-15 DIAGNOSIS — E785 Hyperlipidemia, unspecified: Secondary | ICD-10-CM

## 2021-02-22 ENCOUNTER — Other Ambulatory Visit: Payer: Self-pay | Admitting: Family

## 2021-02-22 DIAGNOSIS — N63 Unspecified lump in unspecified breast: Secondary | ICD-10-CM

## 2021-03-10 ENCOUNTER — Other Ambulatory Visit: Payer: Self-pay | Admitting: Family

## 2021-03-10 DIAGNOSIS — E785 Hyperlipidemia, unspecified: Secondary | ICD-10-CM

## 2021-03-10 NOTE — Telephone Encounter (Signed)
[place]. NTBS 30 days given 02/16/21

## 2021-03-23 ENCOUNTER — Other Ambulatory Visit: Payer: Self-pay | Admitting: Family

## 2021-03-23 DIAGNOSIS — J449 Chronic obstructive pulmonary disease, unspecified: Secondary | ICD-10-CM

## 2021-03-23 DIAGNOSIS — F331 Major depressive disorder, recurrent, moderate: Secondary | ICD-10-CM

## 2021-03-23 DIAGNOSIS — Z720 Tobacco use: Secondary | ICD-10-CM

## 2021-03-29 ENCOUNTER — Ambulatory Visit
Admission: RE | Admit: 2021-03-29 | Discharge: 2021-03-29 | Disposition: A | Discharge status: Home / Self Care | Payer: Commercial Managed Care - PPO | Source: Ambulatory Visit | Attending: Family | Admitting: Family

## 2021-03-29 ENCOUNTER — Other Ambulatory Visit: Payer: Self-pay

## 2021-03-29 DIAGNOSIS — N63 Unspecified lump in unspecified breast: Secondary | ICD-10-CM

## 2021-04-23 ENCOUNTER — Other Ambulatory Visit: Payer: Self-pay | Admitting: Family

## 2021-04-23 DIAGNOSIS — Z720 Tobacco use: Secondary | ICD-10-CM

## 2021-04-23 DIAGNOSIS — J449 Chronic obstructive pulmonary disease, unspecified: Secondary | ICD-10-CM

## 2021-04-23 DIAGNOSIS — F331 Major depressive disorder, recurrent, moderate: Secondary | ICD-10-CM

## 2021-04-27 ENCOUNTER — Other Ambulatory Visit: Payer: Self-pay | Admitting: Family

## 2021-04-27 DIAGNOSIS — F331 Major depressive disorder, recurrent, moderate: Secondary | ICD-10-CM

## 2021-04-27 DIAGNOSIS — J449 Chronic obstructive pulmonary disease, unspecified: Secondary | ICD-10-CM

## 2021-04-27 DIAGNOSIS — Z720 Tobacco use: Secondary | ICD-10-CM

## 2021-05-07 ENCOUNTER — Ambulatory Visit: Payer: Commercial Managed Care - PPO | Admitting: Family

## 2021-05-07 ENCOUNTER — Encounter: Payer: Self-pay | Admitting: Family

## 2021-05-07 ENCOUNTER — Other Ambulatory Visit: Payer: Self-pay

## 2021-05-07 VITALS — BP 130/81 | HR 81 | Temp 97.5°F | Ht 67.0 in | Wt 199.6 lb

## 2021-05-07 DIAGNOSIS — E559 Vitamin D deficiency, unspecified: Secondary | ICD-10-CM

## 2021-05-07 DIAGNOSIS — E039 Hypothyroidism, unspecified: Secondary | ICD-10-CM | POA: Diagnosis not present

## 2021-05-07 DIAGNOSIS — E785 Hyperlipidemia, unspecified: Secondary | ICD-10-CM

## 2021-05-07 DIAGNOSIS — F331 Major depressive disorder, recurrent, moderate: Secondary | ICD-10-CM | POA: Diagnosis not present

## 2021-05-07 DIAGNOSIS — Z23 Encounter for immunization: Secondary | ICD-10-CM | POA: Diagnosis not present

## 2021-05-07 DIAGNOSIS — J449 Chronic obstructive pulmonary disease, unspecified: Secondary | ICD-10-CM | POA: Diagnosis not present

## 2021-05-07 DIAGNOSIS — Z720 Tobacco use: Secondary | ICD-10-CM

## 2021-05-07 DIAGNOSIS — E669 Obesity, unspecified: Secondary | ICD-10-CM

## 2021-05-07 MED ORDER — BUPROPION HCL ER (SR) 150 MG PO TB12: 150.0000 mg | ORAL_TABLET | Freq: Two times a day (BID) | ORAL | 1 refills | Status: DC

## 2021-05-07 MED ORDER — BREZTRI AEROSPHERE 160-9-4.8 MCG/ACT IN AERO: 2.0000 | INHALATION_SPRAY | Freq: Two times a day (BID) | RESPIRATORY_TRACT | 11 refills | Status: DC

## 2021-05-07 MED ORDER — ATORVASTATIN CALCIUM 20 MG PO TABS: 20.0000 mg | ORAL_TABLET | Freq: Every day | ORAL | 1 refills | Status: DC

## 2021-05-07 NOTE — Patient Instructions (Signed)
Chronic Obstructive Pulmonary Disease °Chronic obstructive pulmonary disease (COPD) is a long-term (chronic) condition that affects the lungs. COPD is a general term that can be used to describe many different lung problems that cause lung inflammation and limit airflow, including chronic bronchitis and emphysema. °If you have COPD, your lung function will probably never return to normal. In most cases, it gets worse over time. However, there are steps you can take to slow the progression of the disease and improve your quality of life. °What are the causes? °This condition may be caused by: °Smoking. This is the most common cause. °Certain genes passed down through families. °What increases the risk? °The following factors may make you more likely to develop this condition: °Being exposed to secondhand smoke from cigarettes, pipes, or cigars. °Being exposed to chemicals and other irritants, such as fumes and dust in the work environment. °Having chronic lung conditions or infections. °What are the signs or symptoms? °Symptoms of this condition include: °Shortness of breath, especially during physical activity. °Chronic cough with a large amount of thick mucus. Sometimes, the cough may not have any mucus (dry cough). °Wheezing and rapid breathing. °Gray or bluish discoloration (cyanosis) of the skin, especially in the fingers, toes, or lips. °Feeling tired (fatigue). °Weight loss. °Chest tightness. °Frequent infections. °Episodes when breathing symptoms become much worse (exacerbations). °At the later stages of this disease, you may have swelling in the ankles, feet, or legs. °How is this diagnosed? °This condition is diagnosed based on: °Your medical history. °A physical exam. °You may also have tests, including: °Lung (pulmonary) function tests. This may include a spirometry test, which measures your ability to exhale properly. °Chest X-ray. °CT scan. °Blood tests. °How is this treated? °This condition may be  treated with: °Medicines. These may include inhaled rescue medicines to treat acute exacerbations as well as medicines that you take long-term (maintenance medicines) to prevent flare-ups of COPD. °Bronchodilators help treat COPD by dilating the airways to allow increased airflow and make your breathing more comfortable. °Steroids can reduce airway inflammation and help prevent exacerbations. °Smoking cessation. If you smoke, your health care provider may ask you to quit, and may also recommend therapy or replacement products to help you quit. °Pulmonary rehabilitation. This may involve working with a team of health care providers and specialists, such as respiratory, occupational, and physical therapists. °Exercise and physical activity. These are beneficial for nearly all people with COPD. °Nutrition therapy to gain weight, if you are underweight. °Oxygen. Supplemental oxygen therapy is only helpful if you have a low oxygen level in your blood (hypoxemia). °Lung surgery or transplant. °Palliative care. This is to help people with COPD feel comfortable when treatment is no longer working. °Follow these instructions at home: °Medicines °Take over-the-counter and prescription medicines only as told by your health care provider. This includes inhaled medicines and pills. °Talk to your health care provider before taking any cough or allergy medicines. You may need to avoid certain medicines that dry out your airways. °Lifestyle °If you smoke, the most important thing that you can do is to stop smoking. Continuing to smoke will cause the disease to progress faster. °Do not use any products that contain nicotine or tobacco. These products include cigarettes, chewing tobacco, and vaping devices, such as e-cigarettes. If you need help quitting, ask your health care provider. °Avoid exposure to things that irritate your lungs, such as smoke, chemicals, and fumes. °Stay active, but balance activity with periods of rest.  Exercise and physical   activity will help you maintain your ability to do things you want to do. °Learn and use relaxation techniques to manage stress and to control your breathing. °Get the right amount of sleep and get quality sleep. Most adults need 7 or more hours per night. °Eat healthy foods. Eating smaller, more frequent meals and resting before meals may help you maintain your strength. °Controlled breathing °Learn and use controlled breathing techniques as directed by your health care provider. Controlled breathing techniques include: °Pursed lip breathing. Start by breathing in (inhaling) through your nose for 1 second. Then, purse your lips as if you were going to whistle and breathe out (exhale) through the pursed lips for 2 seconds. °Diaphragmatic breathing. Start by putting one hand on your abdomen just above your waist. Inhale slowly through your nose. The hand on your abdomen should move out. Then purse your lips and exhale slowly. You should be able to feel the hand on your abdomen moving in as you exhale. ° °Controlled coughing °Learn and use controlled coughing to clear mucus from your lungs. Controlled coughing is a series of short, progressive coughs. The steps of controlled coughing are: °Lean your head slightly forward. °Breathe in deeply using diaphragmatic breathing. °Try to hold your breath for 3 seconds. °Keep your mouth slightly open while coughing twice. °Spit any mucus out into a tissue. °Rest and repeat the steps once or twice as needed. °General instructions °Make sure you receive all the vaccines that your health care provider recommends, especially the pneumococcal and influenza vaccines. Preventing infection and hospitalization is very important when you have COPD. °Drink enough fluid to keep your urine pale yellow, unless you have a medical condition that requires fluid restriction. °Use oxygen therapy and pulmonary rehabilitation if told by your health care provider. If you  require home oxygen therapy, ask your health care provider whether you should purchase a pulse oximeter to measure your oxygen level at home. °Work with your health care provider to develop a COPD action plan. This will help you know what steps to take if your condition gets worse. °Keep other chronic health conditions under control as told by your health care provider. °Avoid extreme temperature and humidity changes. °Avoid contact with people who have an illness that spreads from person to person (is contagious), such as viral infections or pneumonia. °Keep all follow-up visits. This is important. °Contact a health care provider if: °You are coughing up more mucus than usual. °There is a change in the color or thickness of your mucus. °Your breathing is more labored than usual. °Your breathing is faster than usual. °You have difficulty sleeping. °You need to use your rescue medicines or inhalers more often than expected. °You have trouble doing routine activities such as getting dressed or walking around the house. °Get help right away if: °You have shortness of breath while you are resting. °You have shortness of breath that prevents you from: °Being able to talk. °Performing your usual physical activities. °You have chest pain lasting longer than 5 minutes. °Your skin color is more blue (cyanotic) than usual. °You measure low oxygen saturations for longer than 5 minutes with a pulse oximeter. °You have a fever. °You feel too tired to breathe normally. °These symptoms may represent a serious problem that is an emergency. Do not wait to see if the symptoms will go away. Get medical help right away. Call your local emergency services (911 in the [place] Do not drive yourself to the hospital. °Summary °Chronic obstructive pulmonary   disease (COPD) is a long-term (chronic) condition that affects the lungs. °Your lung function will probably never return to normal. In most cases, it gets worse over time. However, there  are steps you can take to slow the progression of the disease and improve your quality of life. °Treatment for COPD may include taking medicines, quitting smoking, pulmonary rehabilitation, and changes to diet and exercise. As the disease progresses, you may need oxygen therapy, a lung transplant, or palliative care. °To help manage your condition, do not smoke, avoid exposure to things that irritate your lungs, stay up to date on all vaccines, and follow your health care provider's instructions for taking medicines. °This information is not intended to replace advice given to you by your health care provider. Make sure you discuss any questions you have with your health care provider. °Document Revised: 06/02/2020 Document Reviewed: 06/02/2020 °Elsevier Patient Education © 2022 Elsevier Inc. ° °

## 2021-05-07 NOTE — Progress Notes (Signed)
Subjective:    Patient ID: Kendra Fuller, female    DOB: [birth year], 55 y.o.   MRN: [id number]  Chief Complaint  Patient presents with   Medical Management of Chronic Issues   Pt calls to the office today for chronic follow up. She is followed by GYN.  Thyroid Problem Presents for follow-up visit. Patient reports no constipation, diarrhea, fatigue or hoarse voice. The symptoms have been stable. Her past medical history is significant for hyperlipidemia.  Hyperlipidemia This is a chronic problem. The current episode started more than 1 year ago. Exacerbating diseases include obesity. Current antihyperlipidemic treatment includes statins. The current treatment provides mild improvement of lipids. Risk factors for coronary artery disease include dyslipidemia, a sedentary lifestyle and hypertension.  Depression        This is a chronic problem.  The current episode started more than 1 year ago.   Associated symptoms include no fatigue, no helplessness, no hopelessness and not sad.  Past treatments include SSRIs - Selective serotonin reuptake inhibitors.  Past medical history includes thyroid problem.   Nicotine Dependence Presents for follow-up visit. Symptoms are negative for fatigue. Her urge triggers include company of smokers. The symptoms have been stable. She smokes 1 pack of cigarettes per day.  COPD She continues to smoke 1 1/2 packs a day. Has intermittent SOB.   Review of Systems  Constitutional:  Negative for fatigue.  HENT:  Negative for hoarse voice.   Gastrointestinal:  Negative for constipation and diarrhea.  Psychiatric/Behavioral:  Positive for depression.   All other systems reviewed and are negative.     Objective:   Physical Exam Vitals reviewed.  Constitutional:      General: She is not in acute distress.    Appearance: She is well-developed.  HENT:     Head: Normocephalic and atraumatic.     Right Ear: Tympanic membrane normal.     Left Ear: Tympanic  membrane normal.  Eyes:     Pupils: Pupils are equal, round, and reactive to light.  Neck:     Thyroid: No thyromegaly.  Cardiovascular:     Rate and Rhythm: Normal rate and regular rhythm.     Heart sounds: Normal heart sounds. No murmur heard. Pulmonary:     Effort: Pulmonary effort is normal. No respiratory distress.     Breath sounds: Wheezing present.  Abdominal:     General: Bowel sounds are normal. There is no distension.     Palpations: Abdomen is soft.     Tenderness: There is no abdominal tenderness.  Musculoskeletal:        General: No tenderness. Normal range of motion.     Cervical back: Normal range of motion and neck supple.  Skin:    General: Skin is warm and dry.  Neurological:     Mental Status: She is alert and oriented to person, place, and time.     Cranial Nerves: No cranial nerve deficit.     Deep Tendon Reflexes: Reflexes are normal and symmetric.  Psychiatric:        Behavior: Behavior normal.        Thought Content: Thought content normal.        Judgment: Judgment normal.      BP 130/81   Pulse 81   Temp (!) 97.5 F (36.4 C) (Temporal)   Ht '5\' 7"'  (1.702 m)   Wt 199 lb 9.6 oz (90.5 kg)   LMP 10/07/2014   BMI 31.26 kg/m  Assessment & Plan:  Mayleigh Tetrault comes in today with chief complaint of Medical Management of Chronic Issues   Diagnosis and orders addressed:  1. Chronic obstructive pulmonary disease, unspecified COPD type ([place]) Pt given samples of Breztri today Smoking cessation discussed  - buPROPion (WELLBUTRIN SR) 150 MG 12 hr tablet; Take 1 tablet (150 mg total) by mouth 2 (two) times daily. (NEEDS TO BE SEEN BEFORE NEXT REFILL)  Dispense: 180 tablet; Refill: 1 - CMP14+EGFR - CBC with Differential/Platelet - Budeson-Glycopyrrol-Formoterol (BREZTRI AEROSPHERE) 160-9-4.8 MCG/ACT AERO; Inhale 2 puffs into the lungs 2 (two) times daily.  Dispense: 10.7 g; Refill: 11  2. Tobacco abuse - buPROPion (WELLBUTRIN SR) 150 MG 12  hr tablet; Take 1 tablet (150 mg total) by mouth 2 (two) times daily. (NEEDS TO BE SEEN BEFORE NEXT REFILL)  Dispense: 180 tablet; Refill: 1 - CMP14+EGFR - CBC with Differential/Platelet  3. Moderate episode of recurrent major depressive disorder (HCC) - buPROPion (WELLBUTRIN SR) 150 MG 12 hr tablet; Take 1 tablet (150 mg total) by mouth 2 (two) times daily. (NEEDS TO BE SEEN BEFORE NEXT REFILL)  Dispense: 180 tablet; Refill: 1 - CMP14+EGFR - CBC with Differential/Platelet  4. Hyperlipidemia, unspecified hyperlipidemia type - atorvastatin (LIPITOR) 20 MG tablet; Take 1 tablet (20 mg total) by mouth daily.  Dispense: 90 tablet; Refill: 1 - CMP14+EGFR - CBC with Differential/Platelet  5. Hypothyroidism, unspecified type - CMP14+EGFR - CBC with Differential/Platelet  6. Obesity (BMI 30-39.9) - CMP14+EGFR - CBC with Differential/Platelet  7. Vitamin D deficiency - CMP14+EGFR - CBC with Differential/Platelet  8. Need for immunization against influenza - Flu Vaccine QUAD 32moIM (Fluarix, Fluzone & Alfiuria Quad PF) - Varicella-zoster vaccine IM (Shingrix) - CMP14+EGFR - CBC with Differential/Platelet  9. Need for shingles vaccine - Varicella-zoster vaccine IM (Shingrix)   Labs pending Health Maintenance reviewed Diet and exercise encouraged  Follow up plan: 6 months    CEvelina Dun FNP

## 2021-05-08 LAB — CMP14+EGFR
ALT: 58 IU/L — ABNORMAL HIGH (ref 0–32)
AST: 37 IU/L (ref 0–40)
Albumin/Globulin Ratio: 2 (ref 1.2–2.2)
Albumin: 4.5 g/dL (ref 3.8–4.9)
Alkaline Phosphatase: 93 IU/L (ref 44–121)
BUN/Creatinine Ratio: 11 (ref 9–23)
BUN: 8 mg/dL (ref 6–24)
Bilirubin Total: 0.3 mg/dL (ref 0.0–1.2)
CO2: 23 mmol/L (ref 20–29)
Calcium: 9.1 mg/dL (ref 8.7–10.2)
Chloride: 100 mmol/L (ref 96–106)
Creatinine, Ser: 0.76 mg/dL (ref 0.57–1.00)
Globulin, Total: 2.2 g/dL (ref 1.5–4.5)
Glucose: 92 mg/dL (ref 70–99)
Potassium: 4.3 mmol/L (ref 3.5–5.2)
Sodium: 137 mmol/L (ref 134–144)
Total Protein: 6.7 g/dL (ref 6.0–8.5)
eGFR: 93 mL/min/{1.73_m2} (ref >59)

## 2021-05-08 LAB — CBC WITH DIFFERENTIAL/PLATELET
Basophils Absolute: 0.1 10*3/uL (ref 0.0–0.2)
Basos: 1 %
EOS (ABSOLUTE): 0.3 10*3/uL (ref 0.0–0.4)
Eos: 3 %
Hematocrit: 42.5 % (ref 34.0–46.6)
Hemoglobin: 14.5 g/dL (ref 11.1–15.9)
Immature Grans (Abs): 0 10*3/uL (ref 0.0–0.1)
Immature Granulocytes: 0 %
Lymphocytes Absolute: 3 10*3/uL (ref 0.7–3.1)
Lymphs: 30 %
MCH: 31.8 pg (ref 26.6–33.0)
MCHC: 34.1 g/dL (ref 31.5–35.7)
MCV: 93 fL (ref 79–97)
Monocytes Absolute: 0.8 10*3/uL (ref 0.1–0.9)
Monocytes: 8 %
Neutrophils Absolute: 6 10*3/uL (ref 1.4–7.0)
Neutrophils: 58 %
Platelets: 245 10*3/uL (ref 150–450)
RBC: 4.56 x10E6/uL (ref 3.77–5.28)
RDW: 12 % (ref 11.7–15.4)
WBC: 10.2 10*3/uL (ref 3.4–10.8)

## 2021-05-10 ENCOUNTER — Ambulatory Visit: Payer: Commercial Managed Care - PPO | Admitting: Family

## 2021-05-10 ENCOUNTER — Encounter: Payer: Self-pay | Admitting: Family

## 2021-05-10 ENCOUNTER — Other Ambulatory Visit: Payer: Self-pay | Admitting: Family

## 2021-05-10 ENCOUNTER — Telehealth: Payer: Self-pay | Admitting: Family

## 2021-05-10 DIAGNOSIS — F331 Major depressive disorder, recurrent, moderate: Secondary | ICD-10-CM

## 2021-05-10 DIAGNOSIS — M791 Myalgia, unspecified site: Secondary | ICD-10-CM

## 2021-05-10 DIAGNOSIS — J449 Chronic obstructive pulmonary disease, unspecified: Secondary | ICD-10-CM | POA: Diagnosis not present

## 2021-05-10 DIAGNOSIS — U071 COVID-19: Secondary | ICD-10-CM | POA: Diagnosis not present

## 2021-05-10 MED ORDER — MOLNUPIRAVIR EUA 200MG CAPSULE: 4.0000 | ORAL_CAPSULE | Freq: Two times a day (BID) | ORAL | 0 refills | Status: AC

## 2021-05-10 NOTE — Telephone Encounter (Signed)
Patient aware.

## 2021-05-10 NOTE — Telephone Encounter (Signed)
Molnupiravir Prescription sent to pharmacy. Rest, force fluids, tylenol as needed, Quarantine for at least 5 days and you are fever free, then must wear a mask out in public from day 2-68, report any worsening symptoms such as increased shortness of breath, swelling, or continued high fevers. Possible adverse effects discussed with antivirals.

## 2021-05-10 NOTE — Progress Notes (Signed)
Virtual Visit  Note Due to COVID-19 pandemic this visit was conducted virtually. This visit type was conducted due to national recommendations for restrictions regarding the COVID-19 Pandemic (e.g. social distancing, sheltering in place) in an effort to limit this patient's exposure and mitigate transmission in our community. All issues noted in this document were discussed and addressed.  A physical exam was not performed with this format.  I connected with [name] on 05/10/21 at 12:38 pm  by telephone and verified that I am speaking with the correct person using two identifiers. Kendra Fuller is currently located at home and no one is currently with her  during visit. The provider, [name], FNP is located in their office at time of visit.  I discussed the limitations, risks, security and privacy concerns of performing an evaluation and management service by telephone and the availability of in person appointments. I also discussed with the patient that there may be a patient responsible charge related to this service. The patient expressed understanding and agreed to proceed.   History and Present Illness:  PT calls the office today with cold chills and flu like symptoms that started two days ago.  Cough This is a new problem. The current episode started in the past 7 days. The problem has been waxing and waning. The problem occurs every few minutes. The cough is Productive of sputum. Associated symptoms include chills, a fever, headaches, myalgias, nasal congestion, postnasal drip and wheezing. Pertinent negatives include no ear congestion, ear pain, sore throat or shortness of breath. Risk factors for lung disease include smoking/tobacco exposure. She has tried rest for the symptoms. The treatment provided mild relief.     Review of Systems  Constitutional:  Positive for chills and fever.  HENT:  Positive for postnasal drip. Negative for ear pain and sore throat.    Respiratory:  Positive for cough and wheezing. Negative for shortness of breath.   Musculoskeletal:  Positive for myalgias.  Neurological:  Positive for headaches.    Observations/Objective: No SOB or distress noted, intermittent cough  Assessment and Plan: 1. Myalgia  2. COVID-19 COVID positive, rest, force fluids, tylenol as needed, Quarantine for at least 5 days and you are fever free, then must wear a mask out in public from day 8-41, report any worsening symptoms such as increased shortness of breath, swelling, or continued high fevers. Possible adverse effects discussed with antivirals.  - molnupiravir EUA (LAGEVRIO) 200 mg CAPS capsule; Take 4 capsules (800 mg total) by mouth 2 (two) times daily for 5 days.  Dispense: 40 capsule; Refill: 0  3. Chronic obstructive pulmonary disease, unspecified COPD type (HCC) - molnupiravir EUA (LAGEVRIO) 200 mg CAPS capsule; Take 4 capsules (800 mg total) by mouth 2 (two) times daily for 5 days.  Dispense: 40 capsule; Refill: 0    I discussed the assessment and treatment plan with the patient. The patient was provided an opportunity to ask questions and all were answered. The patient agreed with the plan and demonstrated an understanding of the instructions.   The patient was advised to call back or seek an in-person evaluation if the symptoms worsen or if the condition fails to improve as anticipated.  The above assessment and management plan was discussed with the patient. The patient verbalized understanding of and has agreed to the management plan. Patient is aware to call the clinic if symptoms persist or worsen. Patient is aware when to return to the clinic for a follow-up visit.  Patient educated on when it is appropriate to go to the emergency department.   Time call ended: 12:51 pm    I provided 13 minutes of  non face-to-face time during this encounter.    [name], FNP

## 2021-05-10 NOTE — Telephone Encounter (Signed)
Spoke with patient appointment scheduled  

## 2021-05-10 NOTE — Telephone Encounter (Signed)
Pt received a shingles and flu shot on 9/30 and is having chills, pain and redness where the shot was given, coughing and headache. Please call back and advise.

## 2021-05-10 NOTE — Telephone Encounter (Signed)
Patient called back and at home COVID test was positive. [facility]visit with [facility] today.

## 2021-07-07 NOTE — Progress Notes (Deleted)
Cardiology Office Note    Date:  07/14/2021   ID:  Kendra Fuller, DOB 1966-03-29, MRN [id number]   PCP:  Sharion Balloon, [place]  Cardiologist:  Carlyle Dolly, MD *** Advanced Practice Provider:  No care team member to display Electrophysiologist:  None   [phone]   No chief complaint on file.   History of Present Illness:  Kendra Fuller is a 55 y.o. female history of chest pain, HLD, palpitations and family history of CAD.  Exercise Myoview 10/2020 no ischemia low risk, event monitor isolated episode of SVT lasting less than 10 seconds.  No significant arrhythmias.  Given a prescription for metoprolol as needed.  Patient saw Ms. Strader 01/13/2021 and was doing well.  She was encouraged to reduce alcohol and caffeine.  And stop smoking.    Past Medical History:  Diagnosis Date   Congestion of upper airway    sinus and chest 1 week, fever one nite   GERD (gastroesophageal reflux disease)    occ   Personal history of colonic polyps 10/21/2013   Laparoscopic sigmoid colectomy July 2012  Collected Date: 03/08/2011 Received Date: 03/09/2011 Physician: Aaron Edelman D. 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 - SURGICAL RESECTION MARGINS, NEGATIVE FOR DYSPLASIA OR MALIGNANCY. - SIXTEEN LYMPH NODES, NEGATIVE FOR TUMOR (0/16). - ENDOSCOPIC TATTOO PRESENT [name] Pathologist, Electronic Signature (Case signed 03/10/2011) Specimen Gross and Clinical Information Specimen(s) Obtained: Colon, segmental resection for tumor, splenic flexor with polyp    Rectal bleeding    Thyroid disease    hypothyroidism    Past Surgical History:  Procedure Laterality Date   COLON SURGERY     LAPAROSCOPIC SIGMOID COLECTOMY   03/08/2011   OOPHORECTOMY Left 97   also lft tube   TUBAL LIGATION     left fallopian tube removed   VENTRAL HERNIA REPAIR N/A 12/03/2013   Procedure: LAPAROSCOPIC LYSIS OF ADHESIONS AND REPAIR OF INCARCERATED VENTRAL HERNIA WITH MESH;  Surgeon: Adin Hector, MD;  Location: [facility];  Service: General;  Laterality: N/A;    Current Medications: No outpatient medications have been marked as taking for the 07/14/21 encounter (Appointment) with Imogene Burn, PA-C.     Allergies:   Oxycodone   Social History   Socioeconomic History   Marital status: Single    Spouse name: Not on file   Number of children: Not on file   Years of education: Not on file   Highest education level: Not on file  Occupational History   Not on file  Tobacco Use   Smoking status: Every Day    Packs/day: 1.00    Years: 12.00    Pack years: 12.00    Types: Cigarettes   Smokeless tobacco: Never  Substance and Sexual Activity   Alcohol use: Yes    Comment: occ   Drug use: No   Sexual activity: Not on file  Other Topics Concern   Not on file  Social History Narrative   Not on file   Social Determinants of Health   Financial Resource Strain: Not on file  Food Insecurity: Not on file  Transportation Needs: Not on file  Physical Activity: Not on file  Stress: Not on file  Social Connections: Not on  file     Family History:  The patient's ***family history includes Cancer in her mother; Heart disease in her father.   ROS:   Please see the history of present illness.    ROS All other systems reviewed and are negative.   PHYSICAL EXAM:   VS:  LMP 10/07/2014   Physical Exam  GEN: Well nourished, well developed, in no acute distress  HEENT: normal  Neck: no JVD, carotid bruits, or masses Cardiac:RRR; no murmurs, rubs, or gallops  Respiratory:  clear to auscultation bilaterally, normal work of breathing GI: soft, nontender, nondistended, + BS Ext: without cyanosis, clubbing, or edema, Good  distal pulses bilaterally MS: no deformity or atrophy  Skin: warm and dry, no rash Neuro:  Alert and Oriented x 3, Strength and sensation are intact Psych: euthymic mood, full affect  Wt Readings from Last 3 Encounters:  05/07/21 199 lb 9.6 oz (90.5 kg)  01/13/21 200 lb (90.7 kg)  12/30/20 198 lb (89.8 kg)      Studies/Labs Reviewed:   EKG:  EKG is*** ordered today.  The ekg ordered today demonstrates ***  Recent Labs: 09/24/2020: TSH 1.600 05/07/2021: ALT 58; BUN 8; Creatinine, Ser 0.76; Hemoglobin 14.5; Platelets 245; Potassium 4.3; Sodium 137   Lipid Panel    Component Value Date/Time   CHOL 197 09/24/2020 1116   TRIG 262 (H) 09/24/2020 1116   HDL 39 (L) 09/24/2020 1116   CHOLHDL 5.1 (H) 09/24/2020 1116   LDLCALC 113 (H) 09/24/2020 1116    Additional studies/ records that were reviewed today include:  NST: 10/2020 Extensive motion on EKGS during exercise but no definite ST changes to suggest ischemia Clinically and electrically negative for ischemia Hypertensive response to exercise Myovue with normal perfusion No ischemia or scar Nuclear stress EF: 71%. Low risk study   Event Monitor: 11/05/2020 9 day monitor Rare supraventricular ectopy. Isolated episode of SVT lasting 10 seconds Rare ventricular ectopy No symptoms reported     Patch Wear Time:  9 days and 10 hours (2022-03-08T09:50:02-499 to 2022-03-17T21:29:33-0400)   Patient had a min HR of 64 bpm, max HR of 160 bpm, and avg HR of 90 bpm. Predominant underlying rhythm was Sinus Rhythm. 1 run of Supraventricular Tachycardia occurred lasting 10.4 secs with a max rate of 160 bpm (avg 134 bpm). Isolated SVEs were rare  (<1.0%), SVE Couplets were rare (<1.0%), and SVE Triplets were rare (<1.0%). Isolated VEs were rare (<1.0%), and no VE Couplets or VE Triplets were present.      Risk Assessment/Calculations:   {Does this patient have ATRIAL FIBRILLATION?:[phone]}     ASSESSMENT:    1. SVT  (supraventricular tachycardia) (HCC)   2. Palpitations   3. Chest pain, unspecified type   4. Hyperlipidemia, unspecified hyperlipidemia type   5. Tobacco abuse      PLAN:  In order of problems listed above:  Palpitations/SVT on as needed Toprol  History of chest pain with NST 10/2020 low risk no ischemia  HLD on atorvastatin  Tobacco use  Shared Decision Making/Informed Consent   {Are you ordering a CV Procedure (e.g. stress test, cath, DCCV, TEE, etc)?   Press F2        :[id number]}    Medication Adjustments/Labs and Tests Ordered: Current medicines are reviewed at length with the patient today.  Concerns regarding medicines are outlined above.  Medication changes, Labs and Tests ordered today are listed in the Patient Instructions below. There are no Patient Instructions on file for this visit.  Signed, Ermalinda Barrios, PA-C  07/14/2021 1:10 PM    [facility] [place], [place], [place]  [address] Phone: [phone]; Fax: [phone]

## 2021-07-13 ENCOUNTER — Ambulatory Visit: Payer: Commercial Managed Care - PPO | Admitting: Cardiology

## 2021-07-14 ENCOUNTER — Encounter: Payer: Self-pay | Admitting: Physician Assistant

## 2021-07-14 ENCOUNTER — Ambulatory Visit: Payer: Commercial Managed Care - PPO | Admitting: Physician Assistant

## 2021-07-14 DIAGNOSIS — R079 Chest pain, unspecified: Secondary | ICD-10-CM

## 2021-07-14 DIAGNOSIS — I471 Supraventricular tachycardia: Secondary | ICD-10-CM

## 2021-07-14 DIAGNOSIS — E785 Hyperlipidemia, unspecified: Secondary | ICD-10-CM

## 2021-07-14 DIAGNOSIS — R002 Palpitations: Secondary | ICD-10-CM

## 2021-07-14 DIAGNOSIS — Z720 Tobacco use: Secondary | ICD-10-CM

## 2021-07-25 ENCOUNTER — Other Ambulatory Visit: Payer: Self-pay | Admitting: Family

## 2021-07-25 DIAGNOSIS — E039 Hypothyroidism, unspecified: Secondary | ICD-10-CM

## 2021-09-09 ENCOUNTER — Other Ambulatory Visit: Payer: Self-pay | Admitting: Family

## 2021-09-09 DIAGNOSIS — F331 Major depressive disorder, recurrent, moderate: Secondary | ICD-10-CM

## 2021-11-09 ENCOUNTER — Other Ambulatory Visit: Payer: Self-pay | Admitting: Family

## 2021-11-09 DIAGNOSIS — E785 Hyperlipidemia, unspecified: Secondary | ICD-10-CM

## 2021-11-09 DIAGNOSIS — E039 Hypothyroidism, unspecified: Secondary | ICD-10-CM

## 2021-11-09 DIAGNOSIS — J449 Chronic obstructive pulmonary disease, unspecified: Secondary | ICD-10-CM

## 2021-11-09 DIAGNOSIS — Z720 Tobacco use: Secondary | ICD-10-CM

## 2021-11-09 DIAGNOSIS — F331 Major depressive disorder, recurrent, moderate: Secondary | ICD-10-CM

## 2021-12-11 ENCOUNTER — Other Ambulatory Visit: Payer: Self-pay | Admitting: Family

## 2021-12-11 DIAGNOSIS — E785 Hyperlipidemia, unspecified: Secondary | ICD-10-CM

## 2021-12-11 DIAGNOSIS — J449 Chronic obstructive pulmonary disease, unspecified: Secondary | ICD-10-CM

## 2021-12-11 DIAGNOSIS — Z720 Tobacco use: Secondary | ICD-10-CM

## 2021-12-11 DIAGNOSIS — F331 Major depressive disorder, recurrent, moderate: Secondary | ICD-10-CM

## 2021-12-13 NOTE — Telephone Encounter (Signed)
30 days given 11/09/21 ntbs  ?

## 2021-12-13 NOTE — Telephone Encounter (Signed)
Appt made 6-8 with Christy ?

## 2021-12-15 ENCOUNTER — Other Ambulatory Visit: Payer: Self-pay | Admitting: Family

## 2021-12-15 DIAGNOSIS — Z720 Tobacco use: Secondary | ICD-10-CM

## 2021-12-15 DIAGNOSIS — E039 Hypothyroidism, unspecified: Secondary | ICD-10-CM

## 2021-12-15 DIAGNOSIS — J449 Chronic obstructive pulmonary disease, unspecified: Secondary | ICD-10-CM

## 2021-12-15 DIAGNOSIS — F331 Major depressive disorder, recurrent, moderate: Secondary | ICD-10-CM

## 2021-12-16 ENCOUNTER — Other Ambulatory Visit: Payer: Self-pay | Admitting: Family

## 2021-12-16 DIAGNOSIS — F331 Major depressive disorder, recurrent, moderate: Secondary | ICD-10-CM

## 2021-12-29 ENCOUNTER — Other Ambulatory Visit: Payer: Self-pay | Admitting: Family

## 2021-12-29 ENCOUNTER — Ambulatory Visit: Payer: Commercial Managed Care - PPO

## 2021-12-29 DIAGNOSIS — E039 Hypothyroidism, unspecified: Secondary | ICD-10-CM

## 2022-01-02 ENCOUNTER — Other Ambulatory Visit: Payer: Self-pay | Admitting: Family

## 2022-01-02 DIAGNOSIS — E785 Hyperlipidemia, unspecified: Secondary | ICD-10-CM

## 2022-01-13 ENCOUNTER — Ambulatory Visit: Payer: Commercial Managed Care - PPO | Admitting: Family

## 2022-01-13 ENCOUNTER — Encounter: Payer: Self-pay | Admitting: Family

## 2022-01-13 ENCOUNTER — Other Ambulatory Visit: Payer: Self-pay | Admitting: Family

## 2022-01-13 VITALS — BP 135/85 | HR 88 | Temp 98.6°F | Ht 67.0 in | Wt 209.6 lb

## 2022-01-13 DIAGNOSIS — F331 Major depressive disorder, recurrent, moderate: Secondary | ICD-10-CM | POA: Diagnosis not present

## 2022-01-13 DIAGNOSIS — Z720 Tobacco use: Secondary | ICD-10-CM

## 2022-01-13 DIAGNOSIS — S63601A Unspecified sprain of right thumb, initial encounter: Secondary | ICD-10-CM

## 2022-01-13 DIAGNOSIS — E669 Obesity, unspecified: Secondary | ICD-10-CM

## 2022-01-13 DIAGNOSIS — M79644 Pain in right finger(s): Secondary | ICD-10-CM

## 2022-01-13 DIAGNOSIS — J449 Chronic obstructive pulmonary disease, unspecified: Secondary | ICD-10-CM

## 2022-01-13 DIAGNOSIS — F411 Generalized anxiety disorder: Secondary | ICD-10-CM

## 2022-01-13 DIAGNOSIS — E785 Hyperlipidemia, unspecified: Secondary | ICD-10-CM

## 2022-01-13 DIAGNOSIS — E039 Hypothyroidism, unspecified: Secondary | ICD-10-CM

## 2022-01-13 DIAGNOSIS — E559 Vitamin D deficiency, unspecified: Secondary | ICD-10-CM

## 2022-01-13 MED ORDER — BUSPIRONE HCL 5 MG PO TABS: 5.0000 mg | ORAL_TABLET | Freq: Three times a day (TID) | ORAL | 1 refills | Status: DC | PRN

## 2022-01-13 MED ORDER — ESCITALOPRAM OXALATE 20 MG PO TABS: 20.0000 mg | ORAL_TABLET | Freq: Every day | ORAL | 1 refills | Status: DC

## 2022-01-13 MED ORDER — ATORVASTATIN CALCIUM 20 MG PO TABS: 20.0000 mg | ORAL_TABLET | Freq: Every day | ORAL | 0 refills | Status: DC

## 2022-01-13 MED ORDER — LEVOTHYROXINE SODIUM 88 MCG PO TABS: 88.0000 ug | ORAL_TABLET | Freq: Every day | ORAL | 0 refills | Status: DC

## 2022-01-13 MED ORDER — BREZTRI AEROSPHERE 160-9-4.8 MCG/ACT IN AERO: 2.0000 | INHALATION_SPRAY | Freq: Two times a day (BID) | RESPIRATORY_TRACT | 11 refills | Status: DC

## 2022-01-13 MED ORDER — BUPROPION HCL ER (XL) 300 MG PO TB24: 300.0000 mg | ORAL_TABLET | Freq: Every day | ORAL | 1 refills | Status: DC

## 2022-01-13 NOTE — Progress Notes (Signed)
Subjective:    Patient ID: Kendra Fuller, female    DOB: 15-Jan-1966, 56 y.o.   MRN: [id number]  Chief Complaint  Patient presents with   Medication Refill   Anxiety   Hand Pain    Right thumb - 2 weeks ago jammed it , hit it doing yard work    Pt calls to the office today for chronic follow up. She is followed by GYN.   She has COPD and continue to smoke a pack a day. States she has intermittent SOB that is worse when its hot. She takes the Frost daily. Education given that she should take BID.  Anxiety Presents for follow-up visit. Symptoms include depressed mood, excessive worry, irritability, nervous/anxious behavior and restlessness. Symptoms occur occasionally. The severity of symptoms is moderate.    Hand Pain  The incident occurred more than 1 week ago. Injury mechanism: hit. Pain location: right thumb. The quality of the pain is described as aching. The pain is mild. The pain has been Intermittent since the incident. Pertinent negatives include no numbness or tingling. She has tried NSAIDs for the symptoms. The treatment provided mild relief.  Thyroid Problem Presents for follow-up visit. Symptoms include anxiety, depressed mood, fatigue and hoarse voice. The symptoms have been stable. Her past medical history is significant for hyperlipidemia.  Hyperlipidemia This is a chronic problem. The current episode started more than 1 year ago. The problem is controlled. Exacerbating diseases include obesity. Current antihyperlipidemic treatment includes statins. The current treatment provides moderate improvement of lipids. Risk factors for coronary artery disease include dyslipidemia, hypertension, a sedentary lifestyle and post-menopausal.  Depression        This is a chronic problem.  The current episode started more than 1 year ago.   The onset quality is gradual.   The problem occurs intermittently.  Associated symptoms include fatigue, helplessness, irritable, restlessness and  sad.  Associated symptoms include no hopelessness.     The symptoms are aggravated by family issues.  Past treatments include SSRIs - Selective serotonin reuptake inhibitors.  Past medical history includes thyroid problem and anxiety.   Nicotine Dependence Presents for follow-up visit. Symptoms include fatigue and irritability. Her urge triggers include company of smokers. The symptoms have been stable. She smokes 1 pack of cigarettes per day.      Review of Systems  Constitutional:  Positive for fatigue and irritability.  HENT:  Positive for hoarse voice.   Neurological:  Negative for tingling and numbness.  Psychiatric/Behavioral:  Positive for depression. The patient is nervous/anxious.   All other systems reviewed and are negative.      Objective:   Physical Exam Vitals reviewed.  Constitutional:      General: She is irritable. She is not in acute distress.    Appearance: She is well-developed. She is obese.  HENT:     Head: Normocephalic and atraumatic.     Right Ear: Tympanic membrane normal.     Left Ear: Tympanic membrane normal.  Eyes:     Pupils: Pupils are equal, round, and reactive to light.  Neck:     Thyroid: No thyromegaly.  Cardiovascular:     Rate and Rhythm: Normal rate and regular rhythm.     Heart sounds: Normal heart sounds. No murmur heard. Pulmonary:     Effort: Pulmonary effort is normal. No respiratory distress.     Breath sounds: Normal breath sounds. No wheezing.  Abdominal:     General: Bowel sounds are normal. There is  no distension.     Palpations: Abdomen is soft.     Tenderness: There is no abdominal tenderness.  Musculoskeletal:        General: No tenderness. Normal range of motion.     Cervical back: Normal range of motion and neck supple.  Skin:    General: Skin is warm and dry.  Neurological:     Mental Status: She is alert and oriented to person, place, and time.     Cranial Nerves: No cranial nerve deficit.     Deep Tendon  Reflexes: Reflexes are normal and symmetric.  Psychiatric:        Behavior: Behavior normal.        Thought Content: Thought content normal.        Judgment: Judgment normal.       BP 135/85   Pulse 88   Temp 98.6 F (37 C)   Ht '5\' 7"'  (1.702 m)   Wt 209 lb 9.6 oz (95.1 kg)   LMP 10/07/2014   SpO2 94%   BMI 32.83 kg/m      Assessment & Plan:  Kendra Fuller comes in today with chief complaint of Medication Refill, Anxiety, and Hand Pain (Right thumb - 2 weeks ago jammed it , hit it doing yard work )   Diagnosis and orders addressed:  1. Hyperlipidemia, unspecified hyperlipidemia type - atorvastatin (LIPITOR) 20 MG tablet; Take 1 tablet (20 mg total) by mouth daily. (NEEDS TO BE SEEN BEFORE NEXT REFILL)  Dispense: 30 tablet; Refill: 0 - CMP14+EGFR - Lipid panel  2. Chronic obstructive pulmonary disease, unspecified COPD type ([place]) - CMP14+EGFR - Budeson-Glycopyrrol-Formoterol (BREZTRI AEROSPHERE) 160-9-4.8 MCG/ACT AERO; Inhale 2 puffs into the lungs 2 (two) times daily.  Dispense: 10.7 g; Refill: 11  3. Tobacco abuse - CMP14+EGFR  4. Moderate episode of recurrent major depressive disorder (HCC) Will increase Wellbutrin to 300 mg from 150 mg  Will add Buspar 5 mg TID prn  Stress management  RTO In 1 month  - escitalopram (LEXAPRO) 20 MG tablet; Take 1 tablet (20 mg total) by mouth daily.  Dispense: 90 tablet; Refill: 1 - CMP14+EGFR - buPROPion (WELLBUTRIN XL) 300 MG 24 hr tablet; Take 1 tablet (300 mg total) by mouth daily.  Dispense: 90 tablet; Refill: 1 - busPIRone (BUSPAR) 5 MG tablet; Take 1 tablet (5 mg total) by mouth 3 (three) times daily as needed.  Dispense: 90 tablet; Refill: 1  5. Hypothyroidism, unspecified type - levothyroxine (SYNTHROID) 88 MCG tablet; Take 1 tablet (88 mcg total) by mouth daily.  Dispense: 30 tablet; Refill: 0 - CMP14+EGFR - TSH  6. Vitamin D deficiency - CMP14+EGFR  7. Obesity (BMI 30-39.9) - CMP14+EGFR  8. Pain of right  thumb -Rest NSAIDs - CMP14+EGFR  9. GAD (generalized anxiety disorder)  - escitalopram (LEXAPRO) 20 MG tablet; Take 1 tablet (20 mg total) by mouth daily.  Dispense: 90 tablet; Refill: 1 - CMP14+EGFR - buPROPion (WELLBUTRIN XL) 300 MG 24 hr tablet; Take 1 tablet (300 mg total) by mouth daily.  Dispense: 90 tablet; Refill: 1 - busPIRone (BUSPAR) 5 MG tablet; Take 1 tablet (5 mg total) by mouth 3 (three) times daily as needed.  Dispense: 90 tablet; Refill: 1   10. Sprain of right thumb, unspecified site of digit, initial encounter Given brace ROM exercises  NSAIDS  Labs pending Health Maintenance reviewed Diet and exercise encouraged  Follow up plan: 1 month to recheck GAD and Depression    [name], FNP

## 2022-01-13 NOTE — Patient Instructions (Signed)

## 2022-01-14 LAB — CMP14+EGFR
ALT: 39 IU/L — ABNORMAL HIGH (ref 0–32)
AST: 31 IU/L (ref 0–40)
Albumin/Globulin Ratio: 1.9 (ref 1.2–2.2)
Albumin: 4.2 g/dL (ref 3.8–4.9)
Alkaline Phosphatase: 93 IU/L (ref 44–121)
BUN/Creatinine Ratio: 16 (ref 9–23)
BUN: 12 mg/dL (ref 6–24)
Bilirubin Total: 0.4 mg/dL (ref 0.0–1.2)
CO2: 24 mmol/L (ref 20–29)
Calcium: 9.1 mg/dL (ref 8.7–10.2)
Chloride: 102 mmol/L (ref 96–106)
Creatinine, Ser: 0.75 mg/dL (ref 0.57–1.00)
Globulin, Total: 2.2 g/dL (ref 1.5–4.5)
Glucose: 97 mg/dL (ref 70–99)
Potassium: 4.7 mmol/L (ref 3.5–5.2)
Sodium: 140 mmol/L (ref 134–144)
Total Protein: 6.4 g/dL (ref 6.0–8.5)
eGFR: 94 mL/min/{1.73_m2} (ref >59)

## 2022-01-14 LAB — LIPID PANEL
Chol/HDL Ratio: 3.7 ratio (ref 0.0–4.4)
Cholesterol, Total: 167 mg/dL (ref 100–199)
HDL: 45 mg/dL (ref >39)
LDL Chol Calc (NIH): 96 mg/dL (ref 0–99)
Triglycerides: 145 mg/dL (ref 0–149)
VLDL Cholesterol Cal: 26 mg/dL (ref 5–40)

## 2022-01-14 LAB — TSH: TSH: 2.55 u[IU]/mL (ref 0.450–4.500)

## 2022-01-26 ENCOUNTER — Other Ambulatory Visit: Payer: Self-pay | Admitting: Family

## 2022-01-26 DIAGNOSIS — E785 Hyperlipidemia, unspecified: Secondary | ICD-10-CM

## 2022-02-04 ENCOUNTER — Other Ambulatory Visit: Payer: Self-pay | Admitting: Family

## 2022-02-04 DIAGNOSIS — F331 Major depressive disorder, recurrent, moderate: Secondary | ICD-10-CM

## 2022-02-04 DIAGNOSIS — F411 Generalized anxiety disorder: Secondary | ICD-10-CM

## 2022-03-11 ENCOUNTER — Other Ambulatory Visit: Payer: Self-pay | Admitting: Family

## 2022-03-11 DIAGNOSIS — Z1231 Encounter for screening mammogram for malignant neoplasm of breast: Secondary | ICD-10-CM

## 2022-04-01 ENCOUNTER — Ambulatory Visit
Admission: RE | Admit: 2022-04-01 | Discharge: 2022-04-01 | Disposition: A | Discharge status: Home / Self Care | Payer: Commercial Managed Care - PPO | Source: Ambulatory Visit | Attending: Family | Admitting: Family

## 2022-04-01 DIAGNOSIS — Z1231 Encounter for screening mammogram for malignant neoplasm of breast: Secondary | ICD-10-CM

## 2022-04-28 ENCOUNTER — Ambulatory Visit (INDEPENDENT_AMBULATORY_CARE_PROVIDER_SITE_OTHER): Payer: Commercial Managed Care - PPO | Admitting: Family

## 2022-04-28 ENCOUNTER — Encounter: Payer: Self-pay | Admitting: Family

## 2022-04-28 DIAGNOSIS — R399 Unspecified symptoms and signs involving the genitourinary system: Secondary | ICD-10-CM

## 2022-04-28 LAB — URINALYSIS, COMPLETE
Bilirubin, UA: NEGATIVE
Glucose, UA: NEGATIVE
Leukocytes,UA: NEGATIVE
Nitrite, UA: NEGATIVE
Specific Gravity, UA: 1.025 (ref 1.005–1.030)
Urobilinogen, Ur: 0.2 mg/dL (ref 0.2–1.0)
pH, UA: 6 (ref 5.0–7.5)

## 2022-04-28 LAB — MICROSCOPIC EXAMINATION: Renal Epithel, UA: NONE SEEN /hpf

## 2022-04-28 MED ORDER — CEPHALEXIN 500 MG PO CAPS: 500.0000 mg | ORAL_CAPSULE | Freq: Two times a day (BID) | ORAL | 0 refills | Status: DC

## 2022-04-28 NOTE — Progress Notes (Signed)
   Virtual Visit  Note Due to COVID-19 pandemic this visit was conducted virtually. This visit type was conducted due to national recommendations for restrictions regarding the COVID-19 Pandemic (e.g. social distancing, sheltering in place) in an effort to limit this patient's exposure and mitigate transmission in our community. All issues noted in this document were discussed and addressed.  A physical exam was not performed with this format.  I connected with Kendra Fuller on 04/28/22 at 12:45 pm  by telephone and verified that I am speaking with the correct person using two identifiers. Kendra Fuller is currently located at work and no one is currently with her during visit. The provider, [name], FNP is located in their office at time of visit.  I discussed the limitations, risks, security and privacy concerns of performing an evaluation and management service by telephone and the availability of in person appointments. I also discussed with the patient that there may be a patient responsible charge related to this service. The patient expressed understanding and agreed to proceed.   History and Present Illness:  Dysuria  This is a new problem. The current episode started 1 to 4 weeks ago. The problem occurs intermittently. The problem has been gradually worsening. The quality of the pain is described as burning. The pain is at a severity of 3/10. The pain is mild. Associated symptoms include frequency, hesitancy, nausea and urgency. Pertinent negatives include no hematuria. Associated symptoms comments: Increased urination  at night . She has tried increased fluids for the symptoms. The treatment provided mild relief.      Review of Systems  Gastrointestinal:  Positive for nausea.  Genitourinary:  Positive for dysuria, frequency, hesitancy and urgency. Negative for hematuria.     Observations/Objective: No SOB or distress noted   Assessment and Plan: 1. UTI symptoms PT  will come drop urine off Force fluids AZO over the counter X2 days Follow up if symptoms worsen or do not improve  - Urinalysis, Complete; Future - cephALEXin (KEFLEX) 500 MG capsule; Take 1 capsule (500 mg total) by mouth 2 (two) times daily.  Dispense: 14 capsule; Refill: 0 - Urinalysis, Complete    I discussed the assessment and treatment plan with the patient. The patient was provided an opportunity to ask questions and all were answered. The patient agreed with the plan and demonstrated an understanding of the instructions.   The patient was advised to call back or seek an in-person evaluation if the symptoms worsen or if the condition fails to improve as anticipated.  The above assessment and management plan was discussed with the patient. The patient verbalized understanding of and has agreed to the management plan. Patient is aware to call the clinic if symptoms persist or worsen. Patient is aware when to return to the clinic for a follow-up visit. Patient educated on when it is appropriate to go to the emergency department.   Time call ended:  12:56 pm   I provided 11 minutes of  non face-to-face time during this encounter.    [name], FNP

## 2022-04-29 ENCOUNTER — Other Ambulatory Visit: Payer: Self-pay | Admitting: Family

## 2022-04-29 MED ORDER — FLUCONAZOLE 150 MG PO TABS: 150.0000 mg | ORAL_TABLET | ORAL | 0 refills | Status: DC | PRN

## 2022-05-06 ENCOUNTER — Other Ambulatory Visit: Payer: Self-pay | Admitting: Family

## 2022-05-06 DIAGNOSIS — F411 Generalized anxiety disorder: Secondary | ICD-10-CM

## 2022-05-06 DIAGNOSIS — F331 Major depressive disorder, recurrent, moderate: Secondary | ICD-10-CM

## 2022-05-24 IMAGING — US US BREAST*L* LIMITED INC AXILLA
1 series · 5 of 5 positions shown · non-contrast
Comparison: Previous exam(s).

CLINICAL DATA: Two year follow-up bilateral breast masses.



[Series 1: us breast*left* limited inc axilla · 0.06mm/px · 5 of 5 slices shown]
[im 1/5]
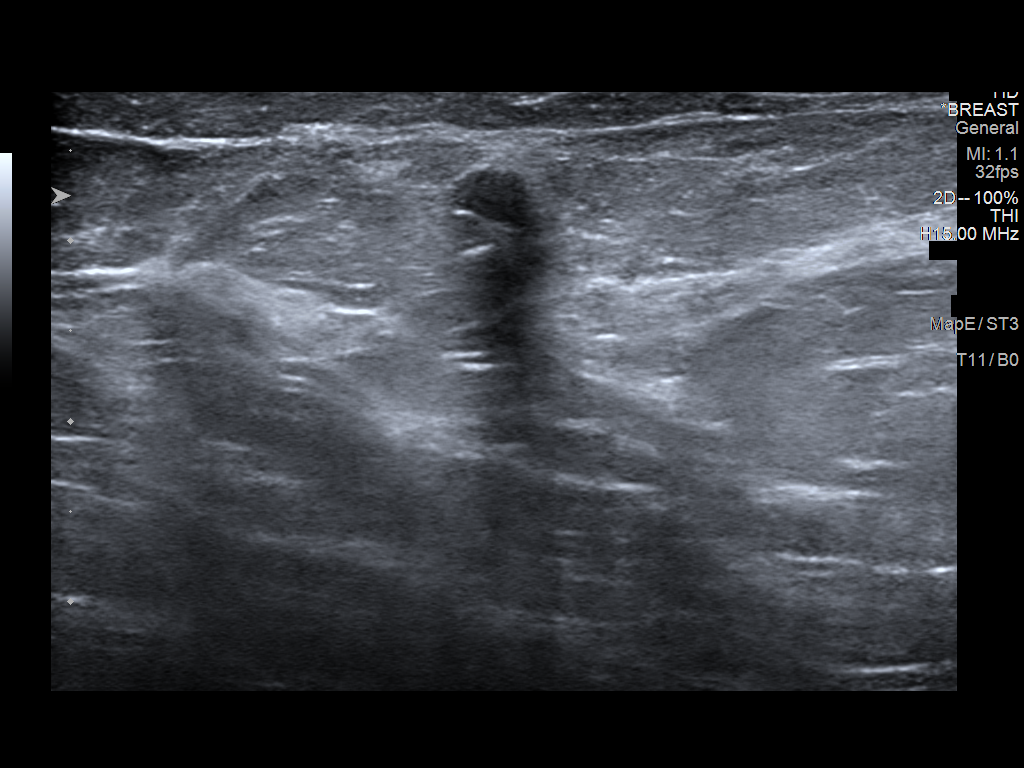
[im 2/5]
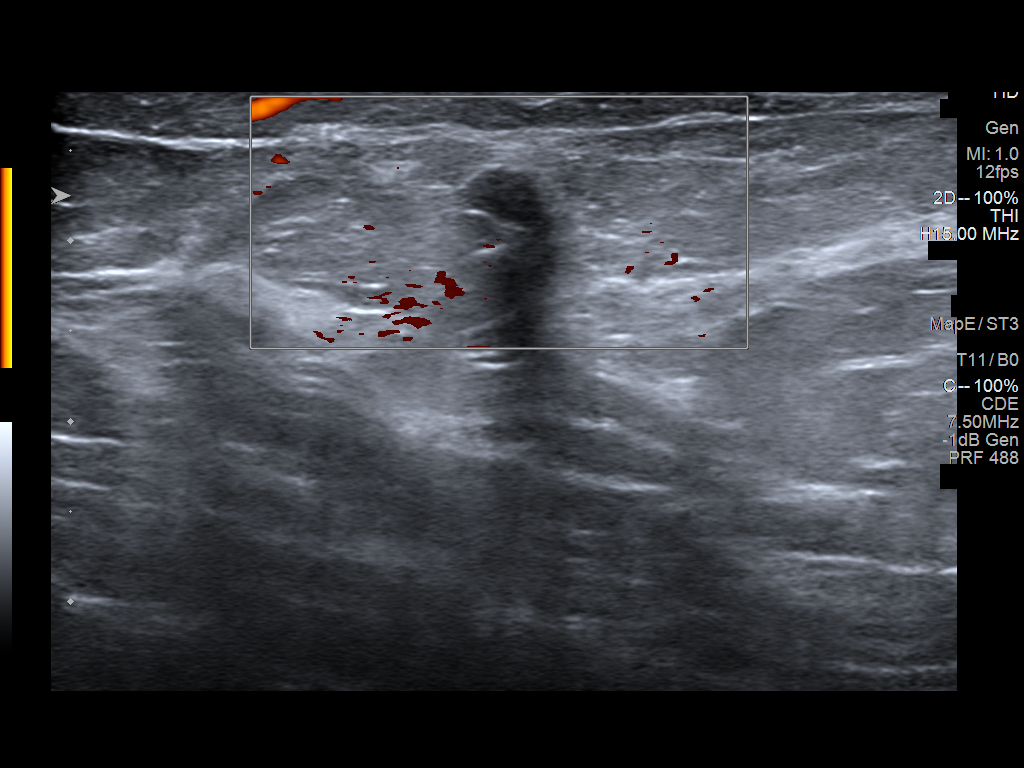
[im 3/5]
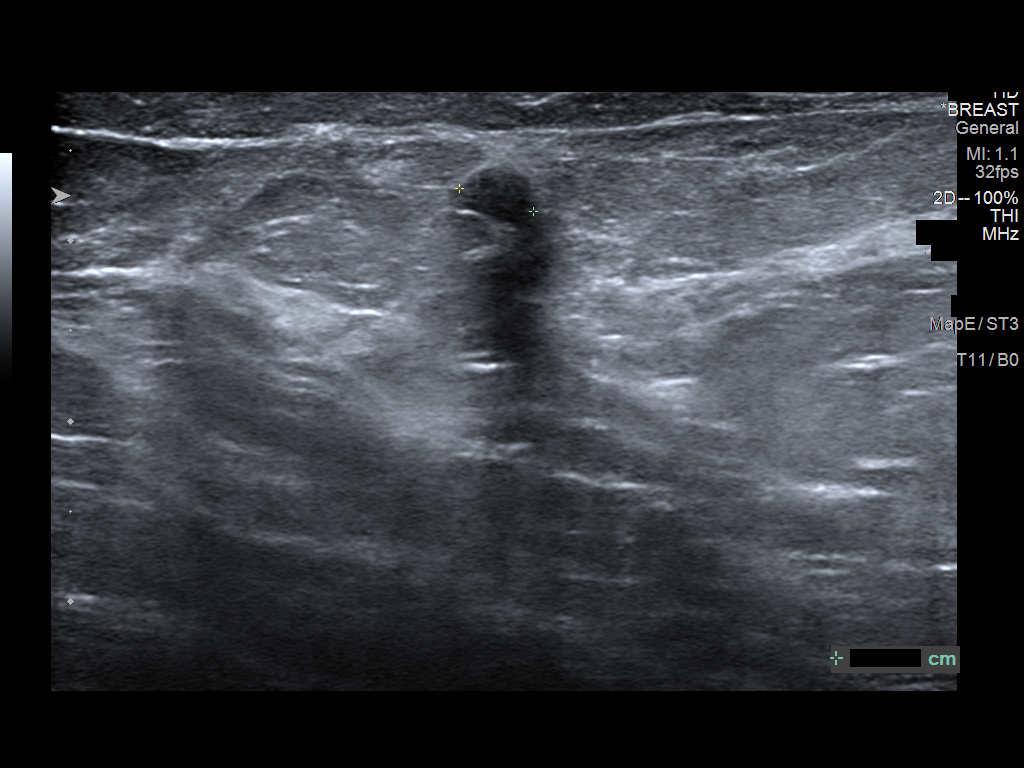
[im 4/5]
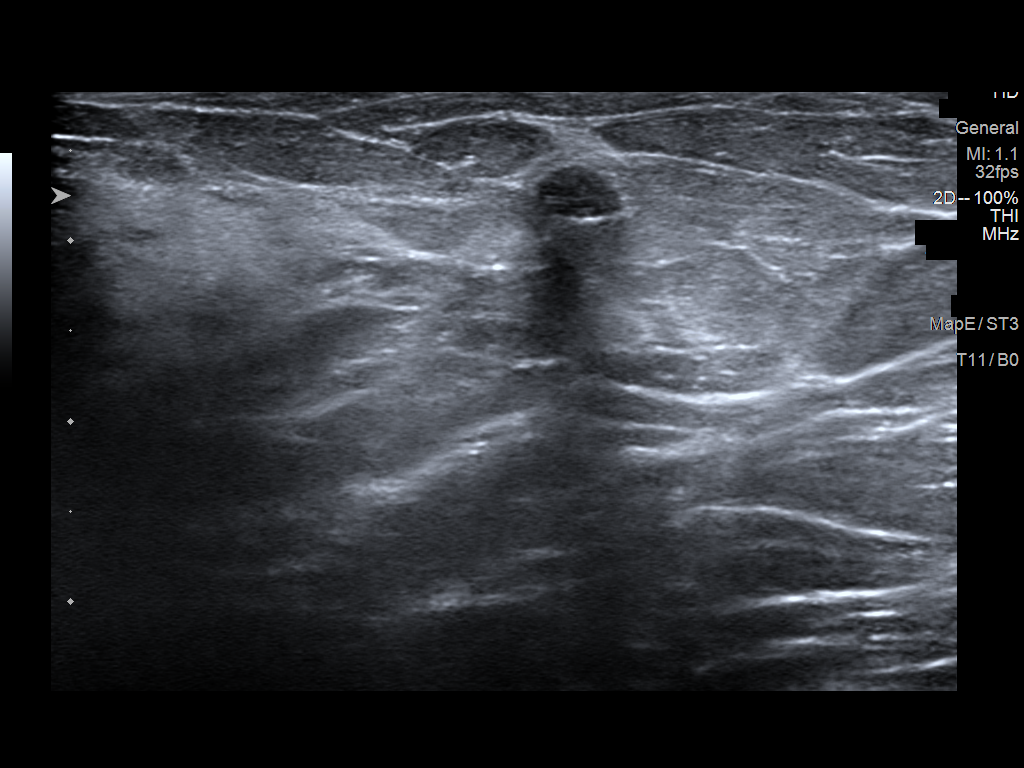
[im 5/5]
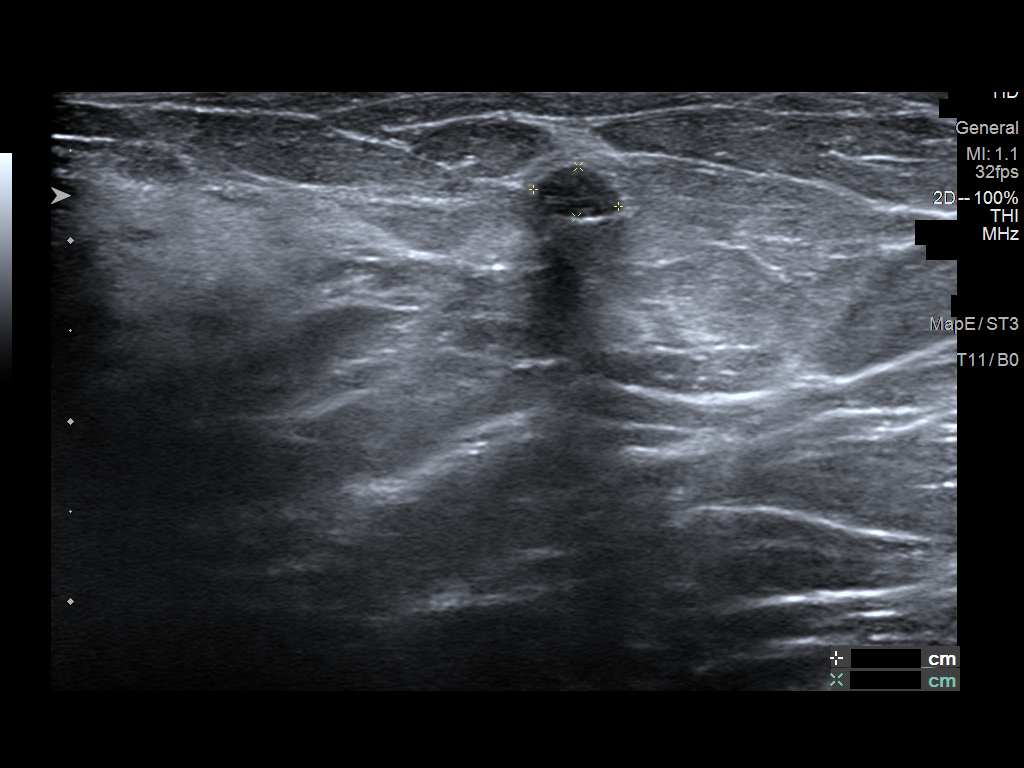

[5 of 5 positions shown; findings below may reference images not displayed]

ACR Breast Density Category c: The breast tissue is heterogeneously
dense, which may obscure small masses.
FINDINGS: The bilateral breast masses are stable on today's study. An
asymmetry in the medial right breast is stable as well. No new or
suspicious findings in either breast.

On physical exam, no suspicious lumps are identified.

Targeted ultrasound is performed, showing a stable mass in the left
breast at 4 o'clock, 3 cm from the nipple measuring 4 x 5 x 3 mm.
There is also a stable mass in the right breast at [DATE], 7 cm from
the nipple measuring 6 x 5 x 6 mm.
IMPRESSION: Stable bilateral breast masses. Given 2 years of stability, no
further follow-up is necessary. Recommend annual screening
mammography.

RECOMMENDATION:
Recommend annual screening mammography.

I have discussed the findings and recommendations with the patient.
If applicable, a reminder letter will be sent to the patient
regarding the next appointment.

BI-RADS CATEGORY  2: Benign.

## 2022-05-27 ENCOUNTER — Other Ambulatory Visit: Payer: Self-pay | Admitting: Family

## 2022-05-27 DIAGNOSIS — F411 Generalized anxiety disorder: Secondary | ICD-10-CM

## 2022-05-27 DIAGNOSIS — F331 Major depressive disorder, recurrent, moderate: Secondary | ICD-10-CM

## 2022-07-10 ENCOUNTER — Other Ambulatory Visit: Payer: Self-pay | Admitting: Family

## 2022-07-10 DIAGNOSIS — F331 Major depressive disorder, recurrent, moderate: Secondary | ICD-10-CM

## 2022-07-10 DIAGNOSIS — F411 Generalized anxiety disorder: Secondary | ICD-10-CM

## 2022-07-23 ENCOUNTER — Other Ambulatory Visit: Payer: Self-pay | Admitting: Family

## 2022-07-23 DIAGNOSIS — E785 Hyperlipidemia, unspecified: Secondary | ICD-10-CM

## 2022-08-02 ENCOUNTER — Other Ambulatory Visit: Payer: Self-pay | Admitting: Family

## 2022-08-02 DIAGNOSIS — F411 Generalized anxiety disorder: Secondary | ICD-10-CM

## 2022-08-02 DIAGNOSIS — F331 Major depressive disorder, recurrent, moderate: Secondary | ICD-10-CM

## 2022-08-02 NOTE — Telephone Encounter (Signed)
30 day supply was sent in 07/10/22. Refill denied-please schedule appt with PCP for further refills.

## 2022-08-02 NOTE — Telephone Encounter (Signed)
I called pt & she said that she doesn't think she will be taking this med any longer. Pharmacy sent request in per pt. No appt made at this time.

## 2022-08-06 ENCOUNTER — Other Ambulatory Visit: Payer: Self-pay | Admitting: Family

## 2022-08-06 DIAGNOSIS — F411 Generalized anxiety disorder: Secondary | ICD-10-CM

## 2022-08-06 DIAGNOSIS — F331 Major depressive disorder, recurrent, moderate: Secondary | ICD-10-CM

## 2022-08-17 ENCOUNTER — Other Ambulatory Visit: Payer: Self-pay | Admitting: Family

## 2022-08-17 DIAGNOSIS — E785 Hyperlipidemia, unspecified: Secondary | ICD-10-CM

## 2022-08-21 ENCOUNTER — Other Ambulatory Visit: Payer: Self-pay | Admitting: Family

## 2022-08-21 DIAGNOSIS — E785 Hyperlipidemia, unspecified: Secondary | ICD-10-CM

## 2022-08-22 NOTE — Telephone Encounter (Signed)
[facility] NTBS 30 days given 07/25/22

## 2022-09-16 ENCOUNTER — Other Ambulatory Visit: Payer: Self-pay | Admitting: Family

## 2022-09-16 ENCOUNTER — Encounter: Payer: Self-pay | Admitting: Family

## 2022-09-16 ENCOUNTER — Ambulatory Visit: Payer: Commercial Managed Care - PPO | Admitting: Family

## 2022-09-16 VITALS — BP 137/87 | HR 93 | Temp 97.8°F | Ht 67.0 in | Wt 217.6 lb

## 2022-09-16 DIAGNOSIS — Z0000 Encounter for general adult medical examination without abnormal findings: Secondary | ICD-10-CM

## 2022-09-16 DIAGNOSIS — R399 Unspecified symptoms and signs involving the genitourinary system: Secondary | ICD-10-CM

## 2022-09-16 DIAGNOSIS — J449 Chronic obstructive pulmonary disease, unspecified: Secondary | ICD-10-CM

## 2022-09-16 DIAGNOSIS — Z0001 Encounter for general adult medical examination with abnormal findings: Secondary | ICD-10-CM | POA: Diagnosis not present

## 2022-09-16 DIAGNOSIS — E559 Vitamin D deficiency, unspecified: Secondary | ICD-10-CM

## 2022-09-16 DIAGNOSIS — E785 Hyperlipidemia, unspecified: Secondary | ICD-10-CM

## 2022-09-16 DIAGNOSIS — F411 Generalized anxiety disorder: Secondary | ICD-10-CM

## 2022-09-16 DIAGNOSIS — E669 Obesity, unspecified: Secondary | ICD-10-CM

## 2022-09-16 DIAGNOSIS — F331 Major depressive disorder, recurrent, moderate: Secondary | ICD-10-CM

## 2022-09-16 DIAGNOSIS — Z6834 Body mass index (BMI) 34.0-34.9, adult: Secondary | ICD-10-CM

## 2022-09-16 DIAGNOSIS — Z720 Tobacco use: Secondary | ICD-10-CM

## 2022-09-16 DIAGNOSIS — E039 Hypothyroidism, unspecified: Secondary | ICD-10-CM

## 2022-09-16 LAB — URINALYSIS, COMPLETE
Bilirubin, UA: NEGATIVE
Glucose, UA: NEGATIVE
Ketones, UA: NEGATIVE
Leukocytes,UA: NEGATIVE
Nitrite, UA: NEGATIVE
Protein,UA: NEGATIVE
Specific Gravity, UA: <1.005 — ABNORMAL LOW (ref 1.005–1.030)
Urobilinogen, Ur: 0.2 mg/dL (ref 0.2–1.0)
pH, UA: 6 (ref 5.0–7.5)

## 2022-09-16 LAB — MICROSCOPIC EXAMINATION
RBC, Urine: NONE SEEN /hpf (ref 0–2)
Renal Epithel, UA: NONE SEEN /hpf

## 2022-09-16 MED ORDER — BUSPIRONE HCL 5 MG PO TABS: 5.0000 mg | ORAL_TABLET | Freq: Three times a day (TID) | ORAL | 0 refills | Status: DC | PRN

## 2022-09-16 MED ORDER — BREZTRI AEROSPHERE 160-9-4.8 MCG/ACT IN AERO: 2.0000 | INHALATION_SPRAY | Freq: Two times a day (BID) | RESPIRATORY_TRACT | 11 refills | Status: AC

## 2022-09-16 MED ORDER — BUPROPION HCL ER (XL) 300 MG PO TB24: 300.0000 mg | ORAL_TABLET | Freq: Every day | ORAL | 0 refills | Status: DC

## 2022-09-16 MED ORDER — ATORVASTATIN CALCIUM 20 MG PO TABS: 20.0000 mg | ORAL_TABLET | Freq: Every day | ORAL | 0 refills | Status: DC

## 2022-09-16 MED ORDER — ESCITALOPRAM OXALATE 20 MG PO TABS: 20.0000 mg | ORAL_TABLET | Freq: Every day | ORAL | 1 refills | Status: DC

## 2022-09-16 NOTE — Progress Notes (Signed)
Subjective:    Patient ID: Kendra Fuller, female    DOB: Dec 24, 1965, 57 y.o.   MRN: [id number]  Chief Complaint  Patient presents with   Medical Management of Chronic Issues   Pt calls to the office today for CPE and chronic follow up. She is followed by GYN.    She has COPD and continue to smoke a pack and half a day. States she has intermittent SOB that is worse when its hot. She takes the [place] daily. Education given that she should take BID.   She had a UTI in 12/23 and wants her urine rechecked today. Denies any dysuria. Does have urinary frequency.  Thyroid Problem Presents for follow-up visit. Symptoms include anxiety. Patient reports no constipation, diarrhea, dry skin, fatigue or hoarse voice. The symptoms have been stable. Her past medical history is significant for hyperlipidemia.  Hyperlipidemia This is a chronic problem. The current episode started more than 1 year ago. The problem is controlled. Recent lipid tests were reviewed and are normal. Exacerbating diseases include obesity. Current antihyperlipidemic treatment includes statins. The current treatment provides moderate improvement of lipids. Risk factors for coronary artery disease include dyslipidemia, hypertension, a sedentary lifestyle and post-menopausal.  Depression        This is a chronic problem.  The current episode started more than 1 year ago.   The onset quality is gradual.   The problem occurs intermittently.  Associated symptoms include no fatigue, no helplessness, no hopelessness, not irritable and not sad.  Past treatments include SSRIs - Selective serotonin reuptake inhibitors.  Past medical history includes thyroid problem and anxiety.   Anxiety Presents for follow-up visit. Symptoms include excessive worry and nervous/anxious behavior. Symptoms occur occasionally. The severity of symptoms is mild.        Review of Systems  Constitutional:  Negative for fatigue.  HENT:  Negative for hoarse  voice.   Gastrointestinal:  Negative for constipation and diarrhea.  Psychiatric/Behavioral:  Positive for depression. The patient is nervous/anxious.   All other systems reviewed and are negative.  Family History  Problem Relation Age of Onset   Cancer Mother        lung cancer   Heart disease Father    Breast cancer Neg Hx    Social History   Socioeconomic History   Marital status: Single    Spouse name: Not on file   Number of children: Not on file   Years of education: Not on file   Highest education level: Not on file  Occupational History   Not on file  Tobacco Use   Smoking status: Every Day    Packs/day: 1.00    Years: 12.00    Total pack years: 12.00    Types: Cigarettes   Smokeless tobacco: Never  Substance and Sexual Activity   Alcohol use: Yes    Comment: occ   Drug use: No   Sexual activity: Not on file  Other Topics Concern   Not on file  Social History Narrative   Not on file   Social Determinants of Health   Financial Resource Strain: Not on file  Food Insecurity: Not on file  Transportation Needs: Not on file  Physical Activity: Not on file  Stress: Not on file  Social Connections: Not on file        Objective:   Physical Exam Vitals reviewed.  Constitutional:      General: She is not irritable.She is not in acute distress.  Appearance: She is well-developed. She is obese.  HENT:     Head: Normocephalic and atraumatic.     Right Ear: Tympanic membrane normal.     Left Ear: Tympanic membrane normal.  Eyes:     Pupils: Pupils are equal, round, and reactive to light.  Neck:     Thyroid: No thyromegaly.  Cardiovascular:     Rate and Rhythm: Normal rate and regular rhythm.     Heart sounds: Normal heart sounds. No murmur heard. Pulmonary:     Effort: Pulmonary effort is normal. No respiratory distress.     Breath sounds: Wheezing present.  Abdominal:     General: Bowel sounds are normal. There is no distension.     Palpations:  Abdomen is soft.     Tenderness: There is no abdominal tenderness.  Musculoskeletal:        General: No tenderness. Normal range of motion.     Cervical back: Normal range of motion and neck supple.  Skin:    General: Skin is warm and dry.  Neurological:     Mental Status: She is alert and oriented to person, place, and time.     Cranial Nerves: No cranial nerve deficit.     Deep Tendon Reflexes: Reflexes are normal and symmetric.  Psychiatric:        Behavior: Behavior normal.        Thought Content: Thought content normal.        Judgment: Judgment normal.       BP 137/87   Pulse 93   Temp 97.8 F (36.6 C) (Temporal)   Ht 5' 7"$  (1.702 m)   Wt 217 lb 9.6 oz (98.7 kg)   LMP 10/07/2014   BMI 34.08 kg/m      Assessment & Plan:  Kendra Fuller comes in today with chief complaint of Medical Management of Chronic Issues   Diagnosis and orders addressed:  1. UTI symptoms - Urinalysis, Complete - Urine Culture - CMP14+EGFR - CBC with Differential/Platelet  2. Hyperlipidemia, unspecified hyperlipidemia type - atorvastatin (LIPITOR) 20 MG tablet; Take 1 tablet (20 mg total) by mouth daily. (NEEDS TO BE SEEN BEFORE NEXT REFILL)  Dispense: 30 tablet; Refill: 0 - CMP14+EGFR - CBC with Differential/Platelet - Lipid panel  3. Chronic obstructive pulmonary disease, unspecified COPD type ([place]) - Budeson-Glycopyrrol-Formoterol (BREZTRI AEROSPHERE) 160-9-4.8 MCG/ACT AERO; Inhale 2 puffs into the lungs 2 (two) times daily.  Dispense: 10.7 g; Refill: 11 - CMP14+EGFR - CBC with Differential/Platelet  4. GAD (generalized anxiety disorder)  - buPROPion (WELLBUTRIN XL) 300 MG 24 hr tablet; Take 1 tablet (300 mg total) by mouth daily.  Dispense: 90 tablet; Refill: 0 - busPIRone (BUSPAR) 5 MG tablet; Take 1 tablet (5 mg total) by mouth 3 (three) times daily as needed. (NEEDS TO BE SEEN BEFORE NEXT REFILL)  Dispense: 90 tablet; Refill: 0 - escitalopram (LEXAPRO) 20 MG tablet; Take 1  tablet (20 mg total) by mouth daily.  Dispense: 90 tablet; Refill: 1 - CMP14+EGFR - CBC with Differential/Platelet  5. Moderate episode of recurrent major depressive disorder (HCC) - buPROPion (WELLBUTRIN XL) 300 MG 24 hr tablet; Take 1 tablet (300 mg total) by mouth daily.  Dispense: 90 tablet; Refill: 0 - busPIRone (BUSPAR) 5 MG tablet; Take 1 tablet (5 mg total) by mouth 3 (three) times daily as needed. (NEEDS TO BE SEEN BEFORE NEXT REFILL)  Dispense: 90 tablet; Refill: 0 - escitalopram (LEXAPRO) 20 MG tablet; Take 1 tablet (20 mg total) by mouth  daily.  Dispense: 90 tablet; Refill: 1 - CMP14+EGFR - CBC with Differential/Platelet  6. Hypothyroidism, unspecified type - CMP14+EGFR - CBC with Differential/Platelet - TSH  7. Obesity (BMI 30-39.9) - CMP14+EGFR - CBC with Differential/Platelet  8. Tobacco abuse - CMP14+EGFR - CBC with Differential/Platelet  9. Vitamin D deficiency - CMP14+EGFR - CBC with Differential/Platelet  10. Annual physical exam - CMP14+EGFR - CBC with Differential/Platelet - Lipid panel - TSH   Labs pending Health Maintenance reviewed Diet and exercise encouraged  Follow up plan: 6 months    [name], FNP

## 2022-09-16 NOTE — Patient Instructions (Signed)
Chronic Obstructive Pulmonary Disease  Chronic obstructive pulmonary disease (COPD) is a long-term (chronic) condition that affects the lungs. COPD is a general term that can be used to describe many different lung problems that cause lung inflammation and limit airflow, including chronic bronchitis and emphysema. If you have COPD, your lung function will probably never return to normal. In most cases, it gets worse over time. However, there are steps you can take to slow the progression of the disease and improve your quality of life. What are the causes? This condition may be caused by: Smoking. This is the most common cause. Certain genes passed down through families. What increases the risk? The following factors may make you more likely to develop this condition: Being exposed to secondhand smoke from cigarettes, pipes, or cigars. Being exposed to chemicals and other irritants, such as fumes and dust in the work environment. Having chronic lung conditions or infections. What are the signs or symptoms? Symptoms of this condition include: Shortness of breath, especially during physical activity. Chronic cough with a large amount of thick mucus. Sometimes, the cough may not have any mucus (dry cough). Wheezing and rapid breathing. Gray or bluish discoloration (cyanosis) of the skin, especially in the fingers, toes, or lips. Feeling tired (fatigue). Weight loss. Chest tightness. Frequent infections. Episodes when breathing symptoms become much worse (exacerbations). At the later stages of this disease, you may have swelling in the ankles, feet, or legs. How is this diagnosed? This condition is diagnosed based on: Your medical history. A physical exam. You may also have tests, including: Lung (pulmonary) function tests. This may include a spirometry test, which measures your ability to exhale properly. Chest X-ray. CT scan. Blood tests. How is this treated? This condition may be  treated with: Medicines. These may include inhaled rescue medicines to treat acute exacerbations as well as medicines that you take long-term (maintenance medicines) to prevent flare-ups of COPD. Bronchodilators help treat COPD by dilating the airways to allow increased airflow and make your breathing more comfortable. Steroids can reduce airway inflammation and help prevent exacerbations. Smoking cessation. If you smoke, your health care provider may ask you to quit, and may also recommend therapy or replacement products to help you quit. Pulmonary rehabilitation. This may involve working with a team of health care providers and specialists, such as respiratory, occupational, and physical therapists. Exercise and physical activity. These are beneficial for nearly all people with COPD. Nutrition therapy to gain weight, if you are underweight. Oxygen. Supplemental oxygen therapy is only helpful if you have a low oxygen level in your blood (hypoxemia). Lung surgery or transplant. Palliative care. This is to help people with COPD feel comfortable when treatment is no longer working. Follow these instructions at home: Medicines Take over-the-counter and prescription medicines only as told by your health care provider. This includes inhaled medicines and pills. Talk to your health care provider before taking any cough or allergy medicines. You may need to avoid certain medicines that dry out your airways. Lifestyle If you smoke, the most important thing that you can do is to stop smoking. Continuing to smoke will cause the disease to progress faster. Do not use any products that contain nicotine or tobacco. These products include cigarettes, chewing tobacco, and vaping devices, such as e-cigarettes. If you need help quitting, ask your health care provider. Avoid exposure to things that irritate your lungs, such as smoke, chemicals, and fumes. Stay active, but balance activity with periods of rest.  Exercise and   physical activity will help you maintain your ability to do things you want to do. Learn and use relaxation techniques to manage stress and to control your breathing. Get the right amount of sleep and get quality sleep. Most adults need 7 or more hours per night. Eat healthy foods. Eating smaller, more frequent meals and resting before meals may help you maintain your strength. Controlled breathing Learn and use controlled breathing techniques as directed by your health care provider. Controlled breathing techniques include: Pursed lip breathing. Start by breathing in (inhaling) through your nose for 1 second. Then, purse your lips as if you were going to whistle and breathe out (exhale) through the pursed lips for 2 seconds. Diaphragmatic breathing. Start by putting one hand on your abdomen just above your waist. Inhale slowly through your nose. The hand on your abdomen should move out. Then purse your lips and exhale slowly. You should be able to feel the hand on your abdomen moving in as you exhale.  Controlled coughing Learn and use controlled coughing to clear mucus from your lungs. Controlled coughing is a series of short, progressive coughs. The steps of controlled coughing are: Lean your head slightly forward. Breathe in deeply using diaphragmatic breathing. Try to hold your breath for 3 seconds. Keep your mouth slightly open while coughing twice. Spit any mucus out into a tissue. Rest and repeat the steps once or twice as needed. General instructions Make sure you receive all the vaccines that your health care provider recommends, especially the pneumococcal and influenza vaccines. Preventing infection and hospitalization is very important when you have COPD. Drink enough fluid to keep your urine pale yellow, unless you have a medical condition that requires fluid restriction. Use oxygen therapy and pulmonary rehabilitation if told by your health care provider. If you  require home oxygen therapy, ask your health care provider whether you should purchase a pulse oximeter to measure your oxygen level at home. Work with your health care provider to develop a COPD action plan. This will help you know what steps to take if your condition gets worse. Keep other chronic health conditions under control as told by your health care provider. Avoid extreme temperature and humidity changes. Avoid contact with people who have an illness that spreads from person to person (is contagious), such as viral infections or pneumonia. Keep all follow-up visits. This is important. Contact a health care provider if: You are coughing up more mucus than usual. There is a change in the color or thickness of your mucus. Your breathing is more labored than usual. Your breathing is faster than usual. You have difficulty sleeping. You need to use your rescue medicines or inhalers more often than expected. You have trouble doing routine activities such as getting dressed or walking around the house. Get help right away if: You have shortness of breath while you are resting. You have shortness of breath that prevents you from: Being able to talk. Performing your usual physical activities. You have chest pain lasting longer than 5 minutes. Your skin color is more blue (cyanotic) than usual. You measure low oxygen saturations for longer than 5 minutes with a pulse oximeter. You have a fever. You feel too tired to breathe normally. These symptoms may represent a serious problem that is an emergency. Do not wait to see if the symptoms will go away. Get medical help right away. Call your local emergency services (911 in the [place] Do not drive yourself to the hospital. Summary Chronic obstructive   pulmonary disease (COPD) is a long-term (chronic) condition that affects the lungs. Your lung function will probably never return to normal. In most cases, it gets worse over time. However, there  are steps you can take to slow the progression of the disease and improve your quality of life. Treatment for COPD may include taking medicines, quitting smoking, pulmonary rehabilitation, and changes to diet and exercise. As the disease progresses, you may need oxygen therapy, a lung transplant, or palliative care. To help manage your condition, do not smoke, avoid exposure to things that irritate your lungs, stay up to date on all vaccines, and follow your health care provider's instructions for taking medicines. This information is not intended to replace advice given to you by your health care provider. Make sure you discuss any questions you have with your health care provider. Document Revised: 06/02/2020 Document Reviewed: 06/02/2020 Elsevier Patient Education  2023 [employer]  

## 2022-09-17 LAB — LIPID PANEL
Chol/HDL Ratio: 4 ratio (ref 0.0–4.4)
Cholesterol, Total: 171 mg/dL (ref 100–199)
HDL: 43 mg/dL (ref >39)
LDL Chol Calc (NIH): 94 mg/dL (ref 0–99)
Triglycerides: 196 mg/dL — ABNORMAL HIGH (ref 0–149)
VLDL Cholesterol Cal: 34 mg/dL (ref 5–40)

## 2022-09-17 LAB — CMP14+EGFR
ALT: 42 IU/L — ABNORMAL HIGH (ref 0–32)
AST: 30 IU/L (ref 0–40)
Albumin/Globulin Ratio: 2 (ref 1.2–2.2)
Albumin: 4.3 g/dL (ref 3.8–4.9)
Alkaline Phosphatase: 95 IU/L (ref 44–121)
BUN/Creatinine Ratio: 11 (ref 9–23)
BUN: 8 mg/dL (ref 6–24)
Bilirubin Total: 0.3 mg/dL (ref 0.0–1.2)
CO2: 22 mmol/L (ref 20–29)
Calcium: 9.1 mg/dL (ref 8.7–10.2)
Chloride: 101 mmol/L (ref 96–106)
Creatinine, Ser: 0.72 mg/dL (ref 0.57–1.00)
Globulin, Total: 2.1 g/dL (ref 1.5–4.5)
Glucose: 97 mg/dL (ref 70–99)
Potassium: 4 mmol/L (ref 3.5–5.2)
Sodium: 139 mmol/L (ref 134–144)
Total Protein: 6.4 g/dL (ref 6.0–8.5)
eGFR: 98 mL/min/{1.73_m2} (ref >59)

## 2022-09-17 LAB — CBC WITH DIFFERENTIAL/PLATELET
Basophils Absolute: 0.1 10*3/uL (ref 0.0–0.2)
Basos: 1 %
EOS (ABSOLUTE): 0.3 10*3/uL (ref 0.0–0.4)
Eos: 2 %
Hematocrit: 40.3 % (ref 34.0–46.6)
Hemoglobin: 13.4 g/dL (ref 11.1–15.9)
Immature Grans (Abs): 0 10*3/uL (ref 0.0–0.1)
Immature Granulocytes: 0 %
Lymphocytes Absolute: 2.9 10*3/uL (ref 0.7–3.1)
Lymphs: 26 %
MCH: 30.7 pg (ref 26.6–33.0)
MCHC: 33.3 g/dL (ref 31.5–35.7)
MCV: 92 fL (ref 79–97)
Monocytes Absolute: 0.8 10*3/uL (ref 0.1–0.9)
Monocytes: 7 %
Neutrophils Absolute: 7.2 10*3/uL — ABNORMAL HIGH (ref 1.4–7.0)
Neutrophils: 64 %
Platelets: 242 10*3/uL (ref 150–450)
RBC: 4.36 x10E6/uL (ref 3.77–5.28)
RDW: 11.9 % (ref 11.7–15.4)
WBC: 11.3 10*3/uL — ABNORMAL HIGH (ref 3.4–10.8)

## 2022-09-17 LAB — TSH: TSH: 3.83 u[IU]/mL (ref 0.450–4.500)

## 2022-10-15 ENCOUNTER — Other Ambulatory Visit: Payer: Self-pay | Admitting: Family

## 2022-10-15 DIAGNOSIS — F411 Generalized anxiety disorder: Secondary | ICD-10-CM

## 2022-10-15 DIAGNOSIS — E785 Hyperlipidemia, unspecified: Secondary | ICD-10-CM

## 2022-10-15 DIAGNOSIS — F331 Major depressive disorder, recurrent, moderate: Secondary | ICD-10-CM

## 2022-11-22 ENCOUNTER — Encounter: Payer: Self-pay | Admitting: Family Medicine

## 2022-12-23 ENCOUNTER — Other Ambulatory Visit: Payer: Self-pay | Admitting: Family

## 2022-12-23 DIAGNOSIS — E039 Hypothyroidism, unspecified: Secondary | ICD-10-CM

## 2023-01-13 ENCOUNTER — Other Ambulatory Visit: Payer: Self-pay | Admitting: Family

## 2023-01-13 DIAGNOSIS — F411 Generalized anxiety disorder: Secondary | ICD-10-CM

## 2023-01-13 DIAGNOSIS — F331 Major depressive disorder, recurrent, moderate: Secondary | ICD-10-CM

## 2023-01-13 DIAGNOSIS — E785 Hyperlipidemia, unspecified: Secondary | ICD-10-CM

## 2023-01-13 NOTE — Telephone Encounter (Signed)
[facility] NTBS in Aug for 6 mos FU RF sent

## 2023-01-13 NOTE — Telephone Encounter (Signed)
MADE APPT FOR AUG 8

## 2023-02-21 ENCOUNTER — Telehealth: Payer: Self-pay | Admitting: Family

## 2023-02-21 DIAGNOSIS — F411 Generalized anxiety disorder: Secondary | ICD-10-CM

## 2023-02-21 DIAGNOSIS — F331 Major depressive disorder, recurrent, moderate: Secondary | ICD-10-CM

## 2023-02-21 MED ORDER — BUPROPION HCL ER (XL) 300 MG PO TB24: 300.0000 mg | ORAL_TABLET | Freq: Every day | ORAL | 0 refills | Status: DC

## 2023-02-21 NOTE — Telephone Encounter (Signed)
  Prescription Request  02/21/2023  Is this a "Controlled Substance" medicine? no  Have you seen your PCP in the last 2 weeks? Appt on 03/16/23 pt is out of this medication  If YES, route message to pool  -  If NO, patient needs to be scheduled for appointment.  What is the name of the medication or equipment? buPROPion (WELLBUTRIN XL) 300 MG 24 hr tablet   Have you contacted your pharmacy to request a refill? yes   Which pharmacy would you like this sent to? [facility]    Patient notified that their request is being sent to the clinical staff for review and that they should receive a response within 2 business days.

## 2023-02-21 NOTE — Telephone Encounter (Signed)
Refill sent in

## 2023-03-13 ENCOUNTER — Ambulatory Visit: Payer: Commercial Managed Care - PPO | Admitting: Family

## 2023-03-16 ENCOUNTER — Ambulatory Visit: Payer: Commercial Managed Care - PPO | Admitting: Family

## 2023-03-16 ENCOUNTER — Ambulatory Visit (INDEPENDENT_AMBULATORY_CARE_PROVIDER_SITE_OTHER): Payer: Commercial Managed Care - PPO

## 2023-03-16 ENCOUNTER — Encounter: Payer: Self-pay | Admitting: Family

## 2023-03-16 VITALS — BP 137/84 | HR 75 | Temp 97.5°F | Ht 67.0 in | Wt 219.6 lb

## 2023-03-16 DIAGNOSIS — M545 Low back pain, unspecified: Secondary | ICD-10-CM | POA: Diagnosis not present

## 2023-03-16 DIAGNOSIS — Z720 Tobacco use: Secondary | ICD-10-CM

## 2023-03-16 DIAGNOSIS — E669 Obesity, unspecified: Secondary | ICD-10-CM

## 2023-03-16 DIAGNOSIS — F411 Generalized anxiety disorder: Secondary | ICD-10-CM

## 2023-03-16 DIAGNOSIS — J449 Chronic obstructive pulmonary disease, unspecified: Secondary | ICD-10-CM

## 2023-03-16 DIAGNOSIS — F331 Major depressive disorder, recurrent, moderate: Secondary | ICD-10-CM | POA: Diagnosis not present

## 2023-03-16 DIAGNOSIS — E559 Vitamin D deficiency, unspecified: Secondary | ICD-10-CM

## 2023-03-16 DIAGNOSIS — E039 Hypothyroidism, unspecified: Secondary | ICD-10-CM | POA: Diagnosis not present

## 2023-03-16 DIAGNOSIS — E785 Hyperlipidemia, unspecified: Secondary | ICD-10-CM

## 2023-03-16 LAB — CBC WITH DIFFERENTIAL/PLATELET
Basophils Absolute: 0.1 10*3/uL (ref 0.0–0.2)
Basos: 1 %
EOS (ABSOLUTE): 0.3 10*3/uL (ref 0.0–0.4)
Eos: 3 %
Hematocrit: 40.9 % (ref 34.0–46.6)
Hemoglobin: 13.5 g/dL (ref 11.1–15.9)
Immature Grans (Abs): 0.1 10*3/uL (ref 0.0–0.1)
Immature Granulocytes: 1 %
Lymphocytes Absolute: 3.1 10*3/uL (ref 0.7–3.1)
Lymphs: 30 %
MCH: 32 pg (ref 26.6–33.0)
MCHC: 33 g/dL (ref 31.5–35.7)
MCV: 97 fL (ref 79–97)
Monocytes Absolute: 0.8 10*3/uL (ref 0.1–0.9)
Monocytes: 8 %
Neutrophils Absolute: 6.1 10*3/uL (ref 1.4–7.0)
Neutrophils: 57 %
Platelets: 219 10*3/uL (ref 150–450)
RBC: 4.22 x10E6/uL (ref 3.77–5.28)
RDW: 12.3 % (ref 11.7–15.4)
WBC: 10.4 10*3/uL (ref 3.4–10.8)

## 2023-03-16 LAB — CMP14+EGFR
Chloride: 102 mmol/L (ref 96–106)
Potassium: 4.1 mmol/L (ref 3.5–5.2)
Sodium: 140 mmol/L (ref 134–144)

## 2023-03-16 MED ORDER — LEVOTHYROXINE SODIUM 88 MCG PO TABS: 88.0000 ug | ORAL_TABLET | Freq: Every day | ORAL | 2 refills | Status: DC

## 2023-03-16 MED ORDER — BUPROPION HCL ER (XL) 300 MG PO TB24: 300.0000 mg | ORAL_TABLET | Freq: Every day | ORAL | 2 refills | Status: DC

## 2023-03-16 MED ORDER — BACLOFEN 10 MG PO TABS
10.0000 mg | ORAL_TABLET | Freq: Three times a day (TID) | ORAL | 2 refills | Status: DC
Start: 2023-03-16 — End: 2023-11-13

## 2023-03-16 MED ORDER — DICLOFENAC SODIUM 75 MG PO TBEC
75.0000 mg | DELAYED_RELEASE_TABLET | Freq: Two times a day (BID) | ORAL | 2 refills | Status: AC
Start: 2023-03-16 — End: ?

## 2023-03-16 NOTE — Progress Notes (Signed)
Subjective:    Patient ID: Kendra Fuller, female    DOB: 10-12-1965, 57 y.o.   MRN: [id number]  Chief Complaint  Patient presents with   Medical Management of Chronic Issues   Pt calls to the office today for chronic follow up. She is followed by GYN.    She has COPD and continue to smoke a pack day. States she has intermittent SOB that is worse when its hot. She takes the [place] as needed. Education given that she should take BID.  Thyroid Problem Presents for follow-up visit. Symptoms include fatigue. Patient reports no anxiety or diarrhea. The symptoms have been stable. Her past medical history is significant for hyperlipidemia.  Nicotine Dependence Presents for follow-up visit. Symptoms include fatigue. Her urge triggers include company of smokers. The symptoms have been stable. She smokes 1 pack of cigarettes per day.  Hyperlipidemia This is a chronic problem. The current episode started more than 1 year ago. The problem is controlled. Recent lipid tests were reviewed and are normal. Current antihyperlipidemic treatment includes statins. The current treatment provides moderate improvement of lipids. Risk factors for coronary artery disease include dyslipidemia, hypertension and a sedentary lifestyle.  Depression        This is a chronic problem.  The current episode started more than 1 year ago.   The problem occurs intermittently.  Associated symptoms include fatigue.  Associated symptoms include no helplessness, no hopelessness and not sad.  Past treatments include SSRIs - Selective serotonin reuptake inhibitors.  Past medical history includes thyroid problem.   Back Pain This is a new problem. The current episode started more than 1 month ago. The problem occurs intermittently. The problem has been waxing and waning since onset. The pain is present in the lumbar spine. The quality of the pain is described as aching. The pain is at a severity of 10/10. The pain is mild. The  symptoms are aggravated by twisting and coughing. Pertinent negatives include no paresis, tingling or weakness. Risk factors include obesity. She has tried NSAIDs for the symptoms. The treatment provided mild relief.      Review of Systems  Constitutional:  Positive for fatigue.  Gastrointestinal:  Negative for diarrhea.  Musculoskeletal:  Positive for back pain.  Neurological:  Negative for tingling and weakness.  Psychiatric/Behavioral:  Positive for depression. The patient is not nervous/anxious.   All other systems reviewed and are negative.      Objective:   Physical Exam Vitals reviewed.  Constitutional:      General: She is not in acute distress.    Appearance: She is well-developed. She is obese.  HENT:     Head: Normocephalic and atraumatic.     Right Ear: Tympanic membrane normal.     Left Ear: Tympanic membrane normal.  Eyes:     Pupils: Pupils are equal, round, and reactive to light.  Neck:     Thyroid: No thyromegaly.  Cardiovascular:     Rate and Rhythm: Normal rate and regular rhythm.     Heart sounds: Normal heart sounds. No murmur heard. Pulmonary:     Effort: Pulmonary effort is normal. No respiratory distress.     Breath sounds: Normal breath sounds. No wheezing.  Abdominal:     General: Bowel sounds are normal. There is no distension.     Palpations: Abdomen is soft.     Tenderness: There is no abdominal tenderness.  Musculoskeletal:        General: No tenderness or signs of  injury. Normal range of motion.     Cervical back: Normal range of motion and neck supple.     Right lower leg: Edema (trace) present.     Left lower leg: Edema (trace) present.     Comments: Full ROM of back  Skin:    General: Skin is warm and dry.  Neurological:     Mental Status: She is alert and oriented to person, place, and time.     Cranial Nerves: No cranial nerve deficit.     Deep Tendon Reflexes: Reflexes are normal and symmetric.  Psychiatric:        Behavior:  Behavior normal.        Thought Content: Thought content normal.        Judgment: Judgment normal.       BP 137/84   Pulse 75   Temp (!) 97.5 F (36.4 C) (Temporal)   Ht 5\' 7"  (1.702 m)   Wt 219 lb 9.6 oz (99.6 kg)   LMP 10/07/2014   SpO2 97%   BMI 34.39 kg/m      Assessment & Plan:   Kendra Fuller comes in today with chief complaint of Medical Management of Chronic Issues   Diagnosis and orders addressed:  1. GAD (generalized anxiety disorder) - buPROPion (WELLBUTRIN XL) 300 MG 24 hr tablet; Take 1 tablet (300 mg total) by mouth daily.  Dispense: 90 tablet; Refill: 2 - CMP14+EGFR - CBC with Differential/Platelet  2. Moderate episode of recurrent major depressive disorder (HCC) - buPROPion (WELLBUTRIN XL) 300 MG 24 hr tablet; Take 1 tablet (300 mg total) by mouth daily.  Dispense: 90 tablet; Refill: 2 - CMP14+EGFR - CBC with Differential/Platelet  3. Hypothyroidism, unspecified type - levothyroxine (SYNTHROID) 88 MCG tablet; Take 1 tablet (88 mcg total) by mouth daily.  Dispense: 90 tablet; Refill: 2 - CMP14+EGFR - CBC with Differential/Platelet  4. Chronic obstructive pulmonary disease, unspecified COPD type (HCC) - CMP14+EGFR - CBC with Differential/Platelet  5. Hyperlipidemia, unspecified hyperlipidemia type - CMP14+EGFR - CBC with Differential/Platelet  6. Obesity (BMI 30-39.9) - CMP14+EGFR - CBC with Differential/Platelet  7. Tobacco abuse - CMP14+EGFR - CBC with Differential/Platelet  8. Vitamin D deficiency - CMP14+EGFR - CBC with Differential/Platelet  9. Acute bilateral low back pain without sciatica Rest Ice  ROM exercises  Diclofenac BID with food  No other NSAID's  Follow up if symptoms worsen or do not improve  - CMP14+EGFR - CBC with Differential/Platelet - DG Lumbar Spine 2-3 Views - diclofenac (VOLTAREN) 75 MG EC tablet; Take 1 tablet (75 mg total) by mouth 2 (two) times daily.  Dispense: 60 tablet; Refill: 2 - Ambulatory  referral to Physical Therapy - baclofen (LIORESAL) 10 MG tablet; Take 1 tablet (10 mg total) by mouth 3 (three) times daily.  Dispense: 60 each; Refill: 2   Labs pending Health Maintenance reviewed Diet and exercise encouraged  Follow up plan: 6 months    Jannifer Rodney, FNP

## 2023-03-16 NOTE — Patient Instructions (Signed)
Acute Back Pain, Adult Acute back pain is sudden and usually short-lived. It is often caused by an injury to the muscles and tissues in the back. The injury may result from: A muscle, tendon, or ligament getting overstretched or torn. Ligaments are tissues that connect bones to each other. Lifting something improperly can cause a back strain. Wear and tear (degeneration) of the spinal disks. Spinal disks are circular tissue that provide cushioning between the bones of the spine (vertebrae). Twisting motions, such as while playing sports or doing yard work. A hit to the back. Arthritis. You may have a physical exam, lab tests, and imaging tests to find the cause of your pain. Acute back pain usually goes away with rest and home care. Follow these instructions at home: Managing pain, stiffness, and swelling Take over-the-counter and prescription medicines only as told by your health care provider. Treatment may include medicines for pain and inflammation that are taken by mouth or applied to the skin, or muscle relaxants. Your health care provider may recommend applying ice during the first 24-48 hours after your pain starts. To do this: Put ice in a plastic bag. Place a towel between your skin and the bag. Leave the ice on for 20 minutes, 2-3 times a day. Remove the ice if your skin turns bright red. This is very important. If you cannot feel pain, heat, or cold, you have a greater risk of damage to the area. If directed, apply heat to the affected area as often as told by your health care provider. Use the heat source that your health care provider recommends, such as a moist heat pack or a heating pad. Place a towel between your skin and the heat source. Leave the heat on for 20-30 minutes. Remove the heat if your skin turns bright red. This is especially important if you are unable to feel pain, heat, or cold. You have a greater risk of getting burned. Activity  Do not stay in bed. Staying in  bed for more than 1-2 days can delay your recovery. Sit up and stand up straight. Avoid leaning forward when you sit or hunching over when you stand. If you work at a desk, sit close to it so you do not need to lean over. Keep your chin tucked in. Keep your neck drawn back, and keep your elbows bent at a 90-degree angle (right angle). Sit high and close to the steering wheel when you drive. Add lower back (lumbar) support to your car seat, if needed. Take short walks on even surfaces as soon as you are able. Try to increase the length of time you walk each day. Do not sit, drive, or stand in one place for more than 30 minutes at a time. Sitting or standing for long periods of time can put stress on your back. Do not drive or use heavy machinery while taking prescription pain medicine. Use proper lifting techniques. When you bend and lift, use positions that put less stress on your back: Bend your knees. Keep the load close to your body. Avoid twisting. Exercise regularly as told by your health care provider. Exercising helps your back heal faster and helps prevent back injuries by keeping muscles strong and flexible. Work with a physical therapist to make a safe exercise program, as recommended by your health care provider. Do any exercises as told by your physical therapist. Lifestyle Maintain a healthy weight. Extra weight puts stress on your back and makes it difficult to have good   posture. Avoid activities or situations that make you feel anxious or stressed. Stress and anxiety increase muscle tension and can make back pain worse. Learn ways to manage anxiety and stress, such as through exercise. General instructions Sleep on a firm mattress in a comfortable position. Try lying on your side with your knees slightly bent. If you lie on your back, put a pillow under your knees. Keep your head and neck in a straight line with your spine (neutral position) when using electronic equipment like  smartphones or pads. To do this: Raise your smartphone or pad to look at it instead of bending your head or neck to look down. Put the smartphone or pad at the level of your face while looking at the screen. Follow your treatment plan as told by your health care provider. This may include: Cognitive or behavioral therapy. Acupuncture or massage therapy. Meditation or yoga. Contact a health care provider if: You have pain that is not relieved with rest or medicine. You have increasing pain going down into your legs or buttocks. Your pain does not improve after 2 weeks. You have pain at night. You lose weight without trying. You have a fever or chills. You develop nausea or vomiting. You develop abdominal pain. Get help right away if: You develop new bowel or bladder control problems. You have unusual weakness or numbness in your arms or legs. You feel faint. These symptoms may represent a serious problem that is an emergency. Do not wait to see if the symptoms will go away. Get medical help right away. Call your local emergency services (911 in the [place] Do not drive yourself to the hospital. Summary Acute back pain is sudden and usually short-lived. Use proper lifting techniques. When you bend and lift, use positions that put less stress on your back. Take over-the-counter and prescription medicines only as told by your health care provider, and apply heat or ice as told. This information is not intended to replace advice given to you by your health care provider. Make sure you discuss any questions you have with your health care provider. Document Revised: 10/16/2020 Document Reviewed: 10/16/2020 Elsevier Patient Education  2024 Elsevier Inc.  

## 2023-03-23 ENCOUNTER — Other Ambulatory Visit: Payer: Self-pay | Admitting: Family

## 2023-03-23 DIAGNOSIS — Z1231 Encounter for screening mammogram for malignant neoplasm of breast: Secondary | ICD-10-CM

## 2023-04-04 ENCOUNTER — Ambulatory Visit
Admission: RE | Admit: 2023-04-04 | Discharge: 2023-04-04 | Disposition: A | Discharge status: Home / Self Care | Payer: Commercial Managed Care - PPO | Source: Ambulatory Visit

## 2023-04-04 DIAGNOSIS — Z1231 Encounter for screening mammogram for malignant neoplasm of breast: Secondary | ICD-10-CM

## 2023-04-05 ENCOUNTER — Telehealth: Payer: Self-pay | Admitting: Family

## 2023-04-05 NOTE — Telephone Encounter (Signed)
Pt states that she is taking the Diclofenac for inflammation. Believes the rash and urinary frequency is coming from the medication. Pt instructed if she is able to tolerate to d/c Diclofenac to see if sx's improve. If not pt instructed to make an appt. Pt understood and will call back if needed.

## 2023-04-05 NOTE — Telephone Encounter (Signed)
Pt called to let PCP know that she has been taking the Diclofenac since 8/9 and for the past 3 days she has broken out in rashes and feels urges to urinate.

## 2023-04-07 ENCOUNTER — Other Ambulatory Visit: Payer: Self-pay | Admitting: Family

## 2023-04-07 DIAGNOSIS — R928 Other abnormal and inconclusive findings on diagnostic imaging of breast: Secondary | ICD-10-CM

## 2023-04-09 ENCOUNTER — Other Ambulatory Visit: Payer: Self-pay | Admitting: Family

## 2023-04-09 DIAGNOSIS — F331 Major depressive disorder, recurrent, moderate: Secondary | ICD-10-CM

## 2023-04-09 DIAGNOSIS — F411 Generalized anxiety disorder: Secondary | ICD-10-CM

## 2023-04-09 DIAGNOSIS — E785 Hyperlipidemia, unspecified: Secondary | ICD-10-CM

## 2023-04-12 ENCOUNTER — Ambulatory Visit
Admission: RE | Admit: 2023-04-12 | Discharge: 2023-04-12 | Disposition: A | Discharge status: Home / Self Care | Payer: Commercial Managed Care - PPO | Source: Ambulatory Visit | Attending: Family | Admitting: Family

## 2023-04-12 DIAGNOSIS — R928 Other abnormal and inconclusive findings on diagnostic imaging of breast: Secondary | ICD-10-CM

## 2023-05-16 ENCOUNTER — Other Ambulatory Visit: Payer: Self-pay | Admitting: Family

## 2023-05-16 DIAGNOSIS — F331 Major depressive disorder, recurrent, moderate: Secondary | ICD-10-CM

## 2023-05-16 DIAGNOSIS — F411 Generalized anxiety disorder: Secondary | ICD-10-CM

## 2023-09-22 ENCOUNTER — Other Ambulatory Visit: Payer: Self-pay | Admitting: Family

## 2023-09-22 DIAGNOSIS — R921 Mammographic calcification found on diagnostic imaging of breast: Secondary | ICD-10-CM

## 2023-09-29 ENCOUNTER — Other Ambulatory Visit: Payer: Self-pay | Admitting: Family

## 2023-09-29 DIAGNOSIS — E785 Hyperlipidemia, unspecified: Secondary | ICD-10-CM

## 2023-09-29 DIAGNOSIS — E039 Hypothyroidism, unspecified: Secondary | ICD-10-CM

## 2023-10-18 ENCOUNTER — Ambulatory Visit
Admission: RE | Admit: 2023-10-18 | Discharge: 2023-10-18 | Disposition: A | Discharge status: Home / Self Care | Payer: Commercial Managed Care - PPO | Source: Ambulatory Visit | Attending: Family | Admitting: Family

## 2023-10-18 DIAGNOSIS — R921 Mammographic calcification found on diagnostic imaging of breast: Secondary | ICD-10-CM

## 2023-10-19 ENCOUNTER — Other Ambulatory Visit: Payer: Self-pay | Admitting: Family

## 2023-10-19 DIAGNOSIS — R921 Mammographic calcification found on diagnostic imaging of breast: Secondary | ICD-10-CM

## 2023-10-23 ENCOUNTER — Other Ambulatory Visit: Payer: Self-pay | Admitting: Family

## 2023-10-23 DIAGNOSIS — E785 Hyperlipidemia, unspecified: Secondary | ICD-10-CM

## 2023-10-31 ENCOUNTER — Encounter: Payer: Self-pay | Admitting: Family

## 2023-10-31 ENCOUNTER — Ambulatory Visit: Admitting: Family

## 2023-10-31 VITALS — BP 128/73 | HR 94 | Temp 96.6°F | Ht 67.0 in | Wt 219.6 lb

## 2023-10-31 DIAGNOSIS — E559 Vitamin D deficiency, unspecified: Secondary | ICD-10-CM

## 2023-10-31 DIAGNOSIS — Z720 Tobacco use: Secondary | ICD-10-CM

## 2023-10-31 DIAGNOSIS — E785 Hyperlipidemia, unspecified: Secondary | ICD-10-CM | POA: Diagnosis not present

## 2023-10-31 DIAGNOSIS — Z0001 Encounter for general adult medical examination with abnormal findings: Secondary | ICD-10-CM

## 2023-10-31 DIAGNOSIS — J449 Chronic obstructive pulmonary disease, unspecified: Secondary | ICD-10-CM

## 2023-10-31 DIAGNOSIS — R351 Nocturia: Secondary | ICD-10-CM

## 2023-10-31 DIAGNOSIS — Z0000 Encounter for general adult medical examination without abnormal findings: Secondary | ICD-10-CM

## 2023-10-31 DIAGNOSIS — Z122 Encounter for screening for malignant neoplasm of respiratory organs: Secondary | ICD-10-CM

## 2023-10-31 DIAGNOSIS — F331 Major depressive disorder, recurrent, moderate: Secondary | ICD-10-CM

## 2023-10-31 DIAGNOSIS — E039 Hypothyroidism, unspecified: Secondary | ICD-10-CM

## 2023-10-31 DIAGNOSIS — E669 Obesity, unspecified: Secondary | ICD-10-CM

## 2023-10-31 LAB — URINALYSIS, COMPLETE
Bilirubin, UA: NEGATIVE
Glucose, UA: NEGATIVE
Leukocytes,UA: NEGATIVE
Nitrite, UA: NEGATIVE
Protein,UA: NEGATIVE
Specific Gravity, UA: 1.015 (ref 1.005–1.030)
Urobilinogen, Ur: 0.2 mg/dL (ref 0.2–1.0)
pH, UA: 6 (ref 5.0–7.5)

## 2023-10-31 LAB — MICROSCOPIC EXAMINATION
Bacteria, UA: NONE SEEN
Renal Epithel, UA: NONE SEEN /HPF
WBC, UA: NONE SEEN /HPF (ref 0–5)
Yeast, UA: NONE SEEN

## 2023-10-31 NOTE — Patient Instructions (Signed)

## 2023-10-31 NOTE — Progress Notes (Signed)
 Subjective:    Patient ID: Kendra Fuller, female    DOB: 01-18-1966, 57 y.o.   MRN: [id number]  Chief Complaint  Patient presents with   Annual Exam   Pt calls to the office today for CPE and chronic follow up. She is followed by GYN.    She has COPD and continue to smoke a pack and half a day. States she has intermittent SOB that is worse when its hot and increased pollen. She has not been able to afford the [facility].  She reports some nocturia over the last month of waking up 4 times a night.    Thyroid Problem Presents for follow-up visit. Symptoms include anxiety, depressed mood and fatigue. Patient reports no constipation, diarrhea, dry skin or hoarse voice. The symptoms have been stable.  Hyperlipidemia This is a chronic problem. The current episode started more than 1 year ago. The problem is controlled. Recent lipid tests were reviewed and are normal. Exacerbating diseases include obesity. Current antihyperlipidemic treatment includes statins. The current treatment provides moderate improvement of lipids. Risk factors for coronary artery disease include dyslipidemia, hypertension, a sedentary lifestyle and post-menopausal.  Depression        This is a chronic problem.  The current episode started more than 1 year ago.   The onset quality is gradual.   The problem occurs intermittently.  Associated symptoms include fatigue and restlessness.  Associated symptoms include no helplessness, no hopelessness, not irritable and not sad.  Past treatments include SSRIs - Selective serotonin reuptake inhibitors.  Past medical history includes thyroid problem and anxiety.   Anxiety Presents for follow-up visit. Symptoms include depressed mood, excessive worry, nervous/anxious behavior and restlessness. Symptoms occur occasionally. The severity of symptoms is mild.        Review of Systems  Constitutional:  Positive for fatigue.  HENT:  Negative for hoarse voice.   Gastrointestinal:   Negative for constipation and diarrhea.  Psychiatric/Behavioral:  The patient is nervous/anxious.   All other systems reviewed and are negative.  Family History  Problem Relation Age of Onset   Cancer Mother        lung cancer   Heart disease Father    Breast cancer Neg Hx    Social History   Socioeconomic History   Marital status: Single    Spouse name: Not on file   Number of children: Not on file   Years of education: Not on file   Highest education level: Some college, no degree  Occupational History   Not on file  Tobacco Use   Smoking status: Every Day    Current packs/day: 1.00    Average packs/day: 1 pack/day for 12.0 years (12.0 ttl pk-yrs)    Types: Cigarettes   Smokeless tobacco: Never  Substance and Sexual Activity   Alcohol use: Yes    Comment: occ   Drug use: No   Sexual activity: Not on file  Other Topics Concern   Not on file  Social History Narrative   Not on file   Social Drivers of Health   Financial Resource Strain: Patient Declined (10/30/2023)   Overall Financial Resource Strain (CARDIA)    Difficulty of Paying Living Expenses: Patient declined  Food Insecurity: Patient Declined (10/30/2023)   Hunger Vital Sign    Worried About Running Out of Food in the Last Year: Patient declined    Ran Out of Food in the Last Year: Patient declined  Transportation Needs: No Transportation Needs (10/30/2023)   PRAPARE -  Administrator, Civil Service (Medical): No    Lack of Transportation (Non-Medical): No  Physical Activity: Insufficiently Active (10/30/2023)   Exercise Vital Sign    Days of Exercise per Week: 2 days    Minutes of Exercise per Session: 10 min  Stress: Stress Concern Present (10/30/2023)   [employer] of Occupational Health - Occupational Stress Questionnaire    Feeling of Stress : To some extent  Social Connections: Unknown (10/30/2023)   Social Connection and Isolation Panel [NHANES]    Frequency of Communication with  Friends and Family: Not on file    Frequency of Social Gatherings with Friends and Family: Not on file    Attends Religious Services: Patient declined    Active Member of Clubs or Organizations: Patient declined    Attends Banker Meetings: Not on file    Marital Status: Never married        Objective:   Physical Exam Vitals reviewed.  Constitutional:      General: She is not irritable.She is not in acute distress.    Appearance: She is well-developed. She is obese.  HENT:     Head: Normocephalic and atraumatic.     Right Ear: Tympanic membrane normal.     Left Ear: Tympanic membrane normal.  Eyes:     Pupils: Pupils are equal, round, and reactive to light.  Neck:     Thyroid: No thyromegaly.  Cardiovascular:     Rate and Rhythm: Normal rate and regular rhythm.     Heart sounds: Normal heart sounds. No murmur heard. Pulmonary:     Effort: Pulmonary effort is normal. No respiratory distress.     Breath sounds: Wheezing present.  Abdominal:     General: Bowel sounds are normal. There is no distension.     Palpations: Abdomen is soft.     Tenderness: There is no abdominal tenderness.  Musculoskeletal:        General: No tenderness. Normal range of motion.     Cervical back: Normal range of motion and neck supple.  Skin:    General: Skin is warm and dry.  Neurological:     Mental Status: She is alert and oriented to person, place, and time.     Cranial Nerves: No cranial nerve deficit.     Deep Tendon Reflexes: Reflexes are normal and symmetric.  Psychiatric:        Behavior: Behavior normal.        Thought Content: Thought content normal.        Judgment: Judgment normal.       BP 128/73   Pulse 94   Temp (!) 96.6 F (35.9 C) (Temporal)   Ht 5\' 7"  (1.702 m)   Wt 219 lb 9.6 oz (99.6 kg)   LMP 10/07/2014   SpO2 95%   BMI 34.39 kg/m      Assessment & Plan:  Kendra Fuller comes in today with chief complaint of Annual Exam   Diagnosis and  orders addressed:  1. Annual physical exam (Primary) - CMP14+EGFR - CBC with Differential/Platelet - Lipid panel - Urinalysis, Complete - TSH - Ambulatory Referral Lung Cancer Screening [facility]  2. Chronic obstructive pulmonary disease, unspecified COPD type (HCC) - CMP14+EGFR - CBC with Differential/Platelet  3. Moderate episode of recurrent major depressive disorder (HCC) - CMP14+EGFR - CBC with Differential/Platelet  4. Hyperlipidemia, unspecified hyperlipidemia type - CMP14+EGFR - CBC with Differential/Platelet - Lipid panel  5. Hypothyroidism, unspecified type - CMP14+EGFR -  CBC with Differential/Platelet - TSH  6. Obesity (BMI 30-39.9) - CMP14+EGFR - CBC with Differential/Platelet  7. Tobacco abuse  - CMP14+EGFR - CBC with Differential/Platelet  8. Vitamin D deficiency - CMP14+EGFR - CBC with Differential/Platelet - VITAMIN D 25 Hydroxy (Vit-D Deficiency, Fractures)  9. Nocturia - CMP14+EGFR - CBC with Differential/Platelet - Urinalysis, Complete  10. Screening for lung cancer - Ambulatory Referral Lung Cancer Screening [facility]    Labs pending Continue current medications  Low dose CT scan Health Maintenance reviewed Diet and exercise encouraged  Follow up plan: 6 months    Jannifer Rodney, FNP

## 2023-11-01 LAB — CBC WITH DIFFERENTIAL/PLATELET
Basophils Absolute: 0.1 10*3/uL (ref 0.0–0.2)
Basos: 1 %
EOS (ABSOLUTE): 0.3 10*3/uL (ref 0.0–0.4)
Eos: 3 %
Hematocrit: 41.5 % (ref 34.0–46.6)
Hemoglobin: 14 g/dL (ref 11.1–15.9)
Immature Grans (Abs): 0 10*3/uL (ref 0.0–0.1)
Immature Granulocytes: 0 %
Lymphocytes Absolute: 3.1 10*3/uL (ref 0.7–3.1)
Lymphs: 32 %
MCH: 31.7 pg (ref 26.6–33.0)
MCHC: 33.7 g/dL (ref 31.5–35.7)
MCV: 94 fL (ref 79–97)
Monocytes Absolute: 0.7 10*3/uL (ref 0.1–0.9)
Monocytes: 8 %
Neutrophils Absolute: 5.5 10*3/uL (ref 1.4–7.0)
Neutrophils: 56 %
Platelets: 243 10*3/uL (ref 150–450)
RBC: 4.42 x10E6/uL (ref 3.77–5.28)
RDW: 12 % (ref 11.7–15.4)
WBC: 9.6 10*3/uL (ref 3.4–10.8)

## 2023-11-01 LAB — CMP14+EGFR
ALT: 35 IU/L — ABNORMAL HIGH (ref 0–32)
AST: 35 IU/L (ref 0–40)
Albumin: 4.1 g/dL (ref 3.8–4.9)
Alkaline Phosphatase: 120 IU/L (ref 44–121)
BUN/Creatinine Ratio: 8 — ABNORMAL LOW (ref 9–23)
BUN: 7 mg/dL (ref 6–24)
Bilirubin Total: 0.3 mg/dL (ref 0.0–1.2)
CO2: 26 mmol/L (ref 20–29)
Calcium: 8.9 mg/dL (ref 8.7–10.2)
Chloride: 101 mmol/L (ref 96–106)
Creatinine, Ser: 0.86 mg/dL (ref 0.57–1.00)
Globulin, Total: 2.3 g/dL (ref 1.5–4.5)
Glucose: 100 mg/dL — ABNORMAL HIGH (ref 70–99)
Potassium: 4.1 mmol/L (ref 3.5–5.2)
Sodium: 140 mmol/L (ref 134–144)
Total Protein: 6.4 g/dL (ref 6.0–8.5)
eGFR: 79 mL/min/{1.73_m2} (ref >59)

## 2023-11-01 LAB — LIPID PANEL
Chol/HDL Ratio: 4.8 ratio — ABNORMAL HIGH (ref 0.0–4.4)
Cholesterol, Total: 179 mg/dL (ref 100–199)
HDL: 37 mg/dL — ABNORMAL LOW (ref >39)
LDL Chol Calc (NIH): 110 mg/dL — ABNORMAL HIGH (ref 0–99)
Triglycerides: 180 mg/dL — ABNORMAL HIGH (ref 0–149)
VLDL Cholesterol Cal: 32 mg/dL (ref 5–40)

## 2023-11-01 LAB — VITAMIN D 25 HYDROXY (VIT D DEFICIENCY, FRACTURES): Vit D, 25-Hydroxy: 44.7 ng/mL (ref 30.0–100.0)

## 2023-11-01 LAB — TSH: TSH: 4.77 u[IU]/mL — ABNORMAL HIGH (ref 0.450–4.500)

## 2023-11-11 ENCOUNTER — Other Ambulatory Visit: Payer: Self-pay | Admitting: Family

## 2023-11-11 DIAGNOSIS — M545 Low back pain, unspecified: Secondary | ICD-10-CM

## 2023-11-11 DIAGNOSIS — F331 Major depressive disorder, recurrent, moderate: Secondary | ICD-10-CM

## 2023-11-11 DIAGNOSIS — F411 Generalized anxiety disorder: Secondary | ICD-10-CM

## 2023-11-11 DIAGNOSIS — E039 Hypothyroidism, unspecified: Secondary | ICD-10-CM

## 2023-11-11 DIAGNOSIS — E785 Hyperlipidemia, unspecified: Secondary | ICD-10-CM

## 2023-12-06 ENCOUNTER — Telehealth: Payer: Self-pay | Admitting: Family Medicine

## 2023-12-06 ENCOUNTER — Telehealth

## 2023-12-06 NOTE — Telephone Encounter (Signed)
 Please schedule patient an appointment.   Copied from CRM [id number]. Topic: Clinical - Medication Question >> Dec 06, 2023  1:47 PM Shelby Dessert H wrote: Reason for CRM: Patient called and wants to know if provider can call in her a rx for her congestion?  Patients callback is [phone]

## 2023-12-06 NOTE — Telephone Encounter (Signed)
 Pt aware ntbs. She will try otc med then make an apt.

## 2024-01-27 ENCOUNTER — Other Ambulatory Visit: Payer: Self-pay | Admitting: Family

## 2024-01-27 DIAGNOSIS — E039 Hypothyroidism, unspecified: Secondary | ICD-10-CM

## 2024-01-29 ENCOUNTER — Other Ambulatory Visit: Payer: Self-pay | Admitting: Family

## 2024-01-29 ENCOUNTER — Telehealth: Payer: Self-pay | Admitting: Family Medicine

## 2024-01-29 DIAGNOSIS — E039 Hypothyroidism, unspecified: Secondary | ICD-10-CM

## 2024-01-29 MED ORDER — LEVOTHYROXINE SODIUM 88 MCG PO TABS: 88.0000 ug | ORAL_TABLET | Freq: Every day | ORAL | 0 refills | Status: DC

## 2024-01-29 NOTE — Telephone Encounter (Signed)
 Copied from CRM [id number]. Topic: Clinical - Request for Lab/Test Order >> Jan 29, 2024 10:14 AM [name] wrote: Reason for CRM: Patient Kendra Fuller is requesting lab work to be ordered. This is in relation to her medication refill request for levothyroxine  (UNITHROID ) 88 MCG tablet. Once this order is placed, she would like a call back for scheduling please. Thanks team!

## 2024-01-29 NOTE — Telephone Encounter (Signed)
 Left message to return call. Future TSH is ordered. Just needs lab appt and pt needs to make a follow up with Christy by the end of September.

## 2024-01-29 NOTE — Telephone Encounter (Unsigned)
 Copied from CRM [id number]. Topic: Clinical - Medication Refill >> Jan 29, 2024 10:12 AM [name] wrote: Medication: levothyroxine  (UNITHROID ) 88 MCG tablet  Has the patient contacted their pharmacy? Yes. The patient wants to schedule an appointment for labs to see if her dosage needs to be adjusted. I am placing a request for lab work to be ordered for the patient.   This is the patient's preferred pharmacy:  [id number] - [place],  - [address] [place] [address] Phone: [phone] Fax: [phone]  Is this the correct pharmacy for this prescription? No If no, delete pharmacy and type the correct one.   Has the prescription been filled recently? Yes  Is the patient out of the medication? No  Has the patient been seen for an appointment in the last year OR does the patient have an upcoming appointment? Yes  Can we respond through MyChart? Yes  Agent: Please be advised that Rx refills may take up to 3 business days. We ask that you follow-up with your pharmacy.

## 2024-01-31 NOTE — Telephone Encounter (Signed)
 [email or web address]

## 2024-02-06 ENCOUNTER — Other Ambulatory Visit

## 2024-02-16 ENCOUNTER — Other Ambulatory Visit

## 2024-03-06 ENCOUNTER — Telehealth: Payer: Self-pay | Admitting: Family

## 2024-03-06 NOTE — Telephone Encounter (Signed)
 Looks like referral was placed in March 2025 and has been closed.    Copied from CRM [id number]. Topic: Appointments - Scheduling Inquiry for Clinic >> Mar 06, 2024 10:29 AM Diannia H wrote: Reason for CRM: Patient missed a call from Imaging and has been trying to get in contact with them to schedule her low dose CT scan, could you assist? Callback number is [phone].

## 2024-03-06 NOTE — Telephone Encounter (Signed)
 This Referral was placed back in March - Patient was contacted by the Office on 3 separate occasions - I do not schedule the CT Lung Cancer Screenings this are scheduled through Pulmonary.   I r/c to Patient - Patient states she put it off and erased the MyChart Message with the Telephone Number to reschedule - I informed Patient she will need to call [facility] to reschedule this exam - Gave Patient their Telephone Number - Patient voiced understanding.

## 2024-03-07 ENCOUNTER — Other Ambulatory Visit

## 2024-03-07 DIAGNOSIS — E039 Hypothyroidism, unspecified: Secondary | ICD-10-CM

## 2024-03-08 ENCOUNTER — Ambulatory Visit: Payer: Self-pay | Admitting: Nurse Practitioner

## 2024-03-08 ENCOUNTER — Other Ambulatory Visit: Payer: Self-pay | Admitting: Family Medicine

## 2024-03-08 DIAGNOSIS — E039 Hypothyroidism, unspecified: Secondary | ICD-10-CM

## 2024-03-08 LAB — TSH: TSH: 7.08 u[IU]/mL — ABNORMAL HIGH (ref 0.450–4.500)

## 2024-03-08 MED ORDER — LEVOTHYROXINE SODIUM 100 MCG PO TABS: 100.0000 ug | ORAL_TABLET | Freq: Every day | ORAL | 1 refills | Status: DC

## 2024-04-01 NOTE — Progress Notes (Deleted)
   Acute Office Visit  Subjective:     Patient ID: Kendra Fuller, female    DOB: 05-14-1966, 58 y.o.   MRN: [id number]  No chief complaint on file.   HPI  ROS Negative unless indicated in HPI    Objective:    LMP 10/07/2014  {Vitals History (Optional):23777}  Physical Exam Pertinent labs & imaging results that were available during my care of the patient were reviewed by me and considered in my medical decision making.  No results found for any visits on 04/04/24.      Assessment & Plan:  There are no diagnoses linked to this encounter.  No follow-ups on file.  @Kendra Fuller  [name] DNP@  Note: This document was prepared by [employer] and any errors that results from this process are unintentional.

## 2024-04-04 ENCOUNTER — Ambulatory Visit: Admitting: Nurse Practitioner

## 2024-04-09 ENCOUNTER — Ambulatory Visit: Admitting: Nurse Practitioner

## 2024-04-12 ENCOUNTER — Encounter: Payer: Self-pay | Admitting: Family

## 2024-04-12 ENCOUNTER — Ambulatory Visit (INDEPENDENT_AMBULATORY_CARE_PROVIDER_SITE_OTHER): Admitting: Family

## 2024-04-12 VITALS — BP 157/84 | HR 95 | Temp 97.2°F | Ht 67.0 in | Wt 222.8 lb

## 2024-04-12 DIAGNOSIS — E039 Hypothyroidism, unspecified: Secondary | ICD-10-CM | POA: Diagnosis not present

## 2024-04-12 DIAGNOSIS — M791 Myalgia, unspecified site: Secondary | ICD-10-CM | POA: Diagnosis not present

## 2024-04-12 DIAGNOSIS — M545 Low back pain, unspecified: Secondary | ICD-10-CM | POA: Diagnosis not present

## 2024-04-12 LAB — URINALYSIS, COMPLETE
Bilirubin, UA: NEGATIVE
Glucose, UA: NEGATIVE
Leukocytes,UA: NEGATIVE
Nitrite, UA: NEGATIVE
Specific Gravity, UA: 1.02 (ref 1.005–1.030)
Urobilinogen, Ur: 1 mg/dL (ref 0.2–1.0)
pH, UA: 6 (ref 5.0–7.5)

## 2024-04-12 LAB — MICROSCOPIC EXAMINATION
Renal Epithel, UA: NONE SEEN /HPF
Yeast, UA: NONE SEEN

## 2024-04-12 MED ORDER — KETOROLAC TROMETHAMINE 60 MG/2ML IM SOLN
60.0000 mg | Freq: Once | INTRAMUSCULAR | Status: AC
  Administered 2024-04-12: 60 mg via INTRAMUSCULAR

## 2024-04-12 MED ORDER — METHYLPREDNISOLONE ACETATE 80 MG/ML IJ SUSP
80.0000 mg | Freq: Once | INTRAMUSCULAR | Status: AC
  Administered 2024-04-12: 80 mg via INTRAMUSCULAR

## 2024-04-12 NOTE — Progress Notes (Signed)
 Subjective:    Patient ID: Kendra Fuller, female    DOB: Jan 11, 1966, 58 y.o.   MRN: [id number]  Chief Complaint  Patient presents with   Back Pain   Neck Pain   Pt presents to the office today with back, neck, and bilateral shoulder aching pain that started three weeks ago. Reports generalized muscle pain.  Back Pain This is a new problem. The current episode started 1 to 4 weeks ago. The problem has been waxing and waning since onset. The pain is present in the lumbar spine. The quality of the pain is described as aching. The pain is moderate.  Neck Pain   Thyroid  Problem Presents for follow-up visit. Symptoms include fatigue. Patient reports no depressed mood, diaphoresis, diarrhea, dry skin or weight gain. The symptoms have been stable.      Review of Systems  Constitutional:  Positive for fatigue. Negative for diaphoresis and weight gain.  Gastrointestinal:  Negative for diarrhea.  Musculoskeletal:  Positive for back pain and neck pain.  All other systems reviewed and are negative.   Social History   Socioeconomic History   Marital status: Single    Spouse name: Not on file   Number of children: Not on file   Years of education: Not on file   Highest education level: Some college, no degree  Occupational History   Not on file  Tobacco Use   Smoking status: Every Day    Current packs/day: 1.00    Average packs/day: 1 pack/day for 12.0 years (12.0 ttl pk-yrs)    Types: Cigarettes   Smokeless tobacco: Never  Substance and Sexual Activity   Alcohol use: Yes    Comment: occ   Drug use: No   Sexual activity: Not on file  Other Topics Concern   Not on file  Social History Narrative   Not on file   Social Drivers of Health   Financial Resource Strain: Patient Declined (10/30/2023)   Overall Financial Resource Strain (CARDIA)    Difficulty of Paying Living Expenses: Patient declined  Food Insecurity: Patient Declined (10/30/2023)   Hunger Vital Sign     Worried About Running Out of Food in the Last Year: Patient declined    Ran Out of Food in the Last Year: Patient declined  Transportation Needs: No Transportation Needs (10/30/2023)   PRAPARE - Administrator, Civil Service (Medical): No    Lack of Transportation (Non-Medical): No  Physical Activity: Insufficiently Active (10/30/2023)   Exercise Vital Sign    Days of Exercise per Week: 2 days    Minutes of Exercise per Session: 10 min  Stress: Stress Concern Present (10/30/2023)   [employer] of Occupational Health - Occupational Stress Questionnaire    Feeling of Stress : To some extent  Social Connections: Unknown (10/30/2023)   Social Connection and Isolation Panel    Frequency of Communication with Friends and Family: Not on file    Frequency of Social Gatherings with Friends and Family: Not on file    Attends Religious Services: Patient declined    Active Member of Clubs or Organizations: Patient declined    Attends Engineer, structural: Not on file    Marital Status: Never married   Family History  Problem Relation Age of Onset   Cancer Mother        lung cancer   Heart disease Father    Breast cancer Neg Hx         Objective:  Physical Exam Vitals reviewed.  Constitutional:      General: She is not in acute distress.    Appearance: She is well-developed.  HENT:     Head: Normocephalic and atraumatic.     Right Ear: Tympanic membrane normal.     Left Ear: Tympanic membrane normal.  Eyes:     Pupils: Pupils are equal, round, and reactive to light.  Neck:     Thyroid : No thyromegaly.  Cardiovascular:     Rate and Rhythm: Normal rate and regular rhythm.     Heart sounds: Normal heart sounds. No murmur heard. Pulmonary:     Effort: Pulmonary effort is normal. No respiratory distress.     Breath sounds: Normal breath sounds. No wheezing.  Abdominal:     General: Bowel sounds are normal. There is no distension.     Palpations: Abdomen is  soft.     Tenderness: There is no abdominal tenderness.  Musculoskeletal:        General: No tenderness. Normal range of motion.     Cervical back: Normal range of motion and neck supple.     Comments: Full ROM of back   Skin:    General: Skin is warm and dry.  Neurological:     Mental Status: She is alert and oriented to person, place, and time.     Cranial Nerves: No cranial nerve deficit.     Deep Tendon Reflexes: Reflexes are normal and symmetric.  Psychiatric:        Behavior: Behavior normal.        Thought Content: Thought content normal.        Judgment: Judgment normal.       BP (!) 157/84   Pulse 95   Temp (!) 97.2 F (36.2 C)   Ht 5' 7 (1.702 m)   Wt 222 lb 12.8 oz (101.1 kg)   LMP 10/07/2014   SpO2 96%   BMI 34.90 kg/m      Assessment & Plan:  Kendra Fuller comes in today with chief complaint of Back Pain and Neck Pain   Diagnosis and orders addressed:  1. Myalgia (Primary) - methylPREDNISolone  acetate (DEPO-MEDROL ) injection 80 mg - ketorolac  (TORADOL ) injection 60 mg - Urinalysis, Complete - CMP14+EGFR - CBC with Differential/Platelet  2. Acute bilateral low back pain without sciatica - methylPREDNISolone  acetate (DEPO-MEDROL ) injection 80 mg - ketorolac  (TORADOL ) injection 60 mg - CMP14+EGFR  3. Hypothyroidism, unspecified type - CMP14+EGFR - TSH   Labs pending Force fluids  Continue current medications  Keep follow up with specialists  Health Maintenance reviewed Diet and exercise encouraged  Return if symptoms worsen or fail to improve.    Bari Learn, FNP

## 2024-04-12 NOTE — Patient Instructions (Signed)
 Muscle Pain, Adult Muscle pain, also called myalgia, is a condition in which a person has pain in one or more muscles in the body. The pain may be mild, moderate, or severe. It may feel sharp, achy, or burning. In most cases, the pain lasts only a short time and goes away on its own. It is normal to feel some muscle pain after you start a new exercise program. Muscles that have not been used a lot will be sore at first. What are the causes? You may have muscle pain when you use your muscles in a new or different way after not having used them for some time. Muscle pain can also be caused by overuse or by stretching a muscle beyond its normal length (muscle strain). You may be more likely to have muscle pain if you are not in shape. Other causes may include: Injury or bruising. Infectious diseases. These include diseases caused by viruses, such as the flu (influenza). Fibromyalgia. This is a long-term (chronic) condition that causes muscle tenderness, tiredness (fatigue), and headache. Autoimmune or rheumatologic diseases. These are conditions, such as lupus, that cause the body's defense system (immune system) to attack areas in the body. Certain medicines. These include ACE inhibitors and statins. What are the signs or symptoms? The main symptom is sore or painful muscles. Your muscles may be sore when you do activities and when you stretch. You may also have slight swelling. How is this diagnosed? Muscle pain is diagnosed with a physical exam. Your health care provider will ask questions about your pain and when it began. If you have not had muscle pain for very long, your provider may want to wait before doing much testing. If your muscle pain has lasted a long time, tests may be done right away. In some cases, you may need tests to rule out other conditions and diseases. How is this treated? Treatment for muscle pain depends on the cause. Home care is usually enough to relieve the pain. Your  provider may also prescribe NSAIDs, such as ibuprofen. Follow these instructions at home: Medicines Take over-the-counter and prescription medicines only as told by your provider. Ask your health care provider if the medicine prescribed to you requires you to avoid driving or using machinery. Managing pain, swelling, and discomfort     If told, put ice on the painful area for the first 2 days of soreness. Put ice in a plastic bag. Place a towel between your skin and the bag. Leave the ice on for 20 minutes, 2-3 times a day. If your skin turns bright red, remove the ice right away to prevent skin damage. The risk of damage is higher if you cannot feel pain, heat, or cold. For the first 2 days of muscle soreness, or if there is swelling: Do not soak in hot baths. Do not use a hot tub, steam room, sauna, heating pad, or other heat source. After 2-3 days, you may switch between putting ice and heat on the area. If told, apply heat to the affected area as often as told by your provider. Use the heat source that your recommends, such as a moist heat pack or a heating pad. Place a towel between your skin and the heat source. Leave the heat on for 20-30 minutes. If your skin turns bright red, remove the ice or heat right away to prevent skin damage. The risk of damage is higher if you cannot feel pain, heat, or cold. If you have an injury,  raise (elevate) the injured area above the level of your heart while you are sitting or lying down. Activity  If your muscle pain is caused by overuse: Slow down your activities until the pain goes away. Do regular, gentle exercises if you are not normally active. Warm up before you exercise. Stretch before and after you exercise. This can help lower the risk of muscle pain. Do not keep working out if the pain is severe. Severe pain could mean that you have injured a muscle. You may have to avoid lifting. Ask your provider how much you can safely lift. Return  to your normal activities as told by your provider. Ask your provider what activities are safe for you. General instructions Do not use any products that contain nicotine or tobacco. These products include cigarettes, chewing tobacco, and vaping devices, such as e-cigarettes. If you need help quitting, ask your provider. Contact a health care provider if: Your muscle pain gets worse, and medicines do not help. The muscle pain lasts longer than 3 days. You have a rash or fever. You have muscle pain after a tick bite. You have muscle pain when you work out, even though you are in good shape. You have redness, soreness, or swelling. You have muscle pain after you start a new medicine or change the dose of a medicine. Get help right away if: You have trouble breathing. You have trouble swallowing. You have muscle pain along with a stiff neck, fever, and vomiting. You have severe muscle weakness, or you cannot move part of your body. You are urinating less, or you have dark, bloody, or discolored urine. You have redness or swelling at the site of the muscle pain. These symptoms may be an emergency. Get help right away. Call 911. Do not wait to see if the symptoms will go away. Do not drive yourself to the hospital. This information is not intended to replace advice given to you by your health care provider. Make sure you discuss any questions you have with your health care provider. Document Revised: 03/04/2022 Document Reviewed: 03/04/2022 Elsevier Patient Education  2024 [employer].

## 2024-04-13 LAB — CBC WITH DIFFERENTIAL/PLATELET
Basophils Absolute: 0.1 x10E3/uL (ref 0.0–0.2)
Basos: 1 %
EOS (ABSOLUTE): 0.4 x10E3/uL (ref 0.0–0.4)
Eos: 4 %
Hematocrit: 39.4 % (ref 34.0–46.6)
Hemoglobin: 13.2 g/dL (ref 11.1–15.9)
Immature Grans (Abs): 0 x10E3/uL (ref 0.0–0.1)
Immature Granulocytes: 0 %
Lymphocytes Absolute: 3.2 x10E3/uL — ABNORMAL HIGH (ref 0.7–3.1)
Lymphs: 26 %
MCH: 31.7 pg (ref 26.6–33.0)
MCHC: 33.5 g/dL (ref 31.5–35.7)
MCV: 95 fL (ref 79–97)
Monocytes Absolute: 1.1 x10E3/uL — ABNORMAL HIGH (ref 0.1–0.9)
Monocytes: 9 %
Neutrophils Absolute: 7.3 x10E3/uL — ABNORMAL HIGH (ref 1.4–7.0)
Neutrophils: 60 %
Platelets: 286 x10E3/uL (ref 150–450)
RBC: 4.16 x10E6/uL (ref 3.77–5.28)
RDW: 12 % (ref 11.7–15.4)
WBC: 12.2 x10E3/uL — ABNORMAL HIGH (ref 3.4–10.8)

## 2024-04-13 LAB — CMP14+EGFR
ALT: 24 IU/L (ref 0–32)
AST: 21 IU/L (ref 0–40)
Albumin: 4.1 g/dL (ref 3.8–4.9)
Alkaline Phosphatase: 127 IU/L — ABNORMAL HIGH (ref 44–121)
BUN/Creatinine Ratio: 11 (ref 9–23)
BUN: 8 mg/dL (ref 6–24)
Bilirubin Total: 0.4 mg/dL (ref 0.0–1.2)
CO2: 25 mmol/L (ref 20–29)
Calcium: 9 mg/dL (ref 8.7–10.2)
Chloride: 103 mmol/L (ref 96–106)
Creatinine, Ser: 0.74 mg/dL (ref 0.57–1.00)
Globulin, Total: 2.1 g/dL (ref 1.5–4.5)
Glucose: 103 mg/dL — ABNORMAL HIGH (ref 70–99)
Potassium: 3.8 mmol/L (ref 3.5–5.2)
Sodium: 139 mmol/L (ref 134–144)
Total Protein: 6.2 g/dL (ref 6.0–8.5)
eGFR: 94 mL/min/1.73 (ref >59)

## 2024-04-13 LAB — TSH: TSH: 3.75 u[IU]/mL (ref 0.450–4.500)

## 2024-04-15 ENCOUNTER — Ambulatory Visit: Payer: Self-pay | Admitting: Family

## 2024-04-15 ENCOUNTER — Other Ambulatory Visit: Payer: Self-pay | Admitting: Family

## 2024-04-15 DIAGNOSIS — Z122 Encounter for screening for malignant neoplasm of respiratory organs: Secondary | ICD-10-CM

## 2024-04-15 DIAGNOSIS — R319 Hematuria, unspecified: Secondary | ICD-10-CM

## 2024-04-17 NOTE — Telephone Encounter (Signed)
 Bari, can you order another low CT for patient? Says you ordered her one back in March but she was unable to go at that time.   Copied from CRM [id number]. Topic: Appointments - Scheduling Inquiry for Clinic >> Apr 17, 2024 10:05 AM Diannia H wrote: Reason for CRM: Patient is wanting to go ahead and get her low does CT scheduled and done. Could you assist? Patients callback number is [phone], you can also reply back in Mychart.

## 2024-04-18 ENCOUNTER — Telehealth: Payer: Self-pay | Admitting: Acute Care

## 2024-04-18 DIAGNOSIS — Z122 Encounter for screening for malignant neoplasm of respiratory organs: Secondary | ICD-10-CM

## 2024-04-18 DIAGNOSIS — Z87891 Personal history of nicotine dependence: Secondary | ICD-10-CM

## 2024-04-18 DIAGNOSIS — F17210 Nicotine dependence, cigarettes, uncomplicated: Secondary | ICD-10-CM

## 2024-04-18 NOTE — Telephone Encounter (Signed)
 Lung Cancer Screening Narrative/Criteria Questionnaire (Cigarette Smokers Only- No Cigars/Pipes/vapes)   Kendra Fuller   SDMV:04/26/2024 10:30 Kendra Fuller        09/22/1965   LDCT: 05/02/2024 4:30 Kendra Fuller    58 y.o.   Phone: [phone]  Lung Screening Narrative (confirm age 27-77 yrs Medicare / 50-80 yrs Private pay insurance)   Insurance information:[employer]   Referring Provider:Christy Lavell FNP   This screening involves an initial phone call with a team member from our program. It is called a shared decision making visit. The initial meeting is required by  insurance and Medicare to make sure you understand the program. This appointment takes about 15-20 minutes to complete. You will complete the screening scan at your scheduled date/time.  This scan takes about 5-10 minutes to complete. You can eat and drink normally before and after the scan.  Criteria questions for Lung Cancer Screening:   Are you a current or former smoker? Current Age began smoking: 58yo   If you are a former smoker, what year did you quit smoking? Quit for 8 years then started back again(within 15 yrs)   To calculate your smoking history, I need an accurate estimate of how many packs of cigarettes you smoked per day and for how many years. (Not just the number of PPD you are now smoking)   Years smoking 30 x Packs per day 1.5 = Pack years 45   (at least 20 pack yrs)   (Make sure they understand that we need to know how much they have smoked in the past, not just the number of PPD they are smoking now)  Do you have a personal history of cancer?  No    Do you have a family history of cancer? Yes  (cancer type and and relative) mother - lung  Are you coughing up blood?  No  Have you had unexplained weight loss of 15 lbs or more in the last 6 months? No  It looks like you meet all criteria.  When would be a good time for us  to schedule you for this screening?   Additional information: N/A

## 2024-04-24 ENCOUNTER — Ambulatory Visit
Admission: RE | Admit: 2024-04-24 | Discharge: 2024-04-24 | Disposition: A | Discharge status: Home / Self Care | Source: Ambulatory Visit | Attending: Family | Admitting: Family

## 2024-04-24 DIAGNOSIS — R921 Mammographic calcification found on diagnostic imaging of breast: Secondary | ICD-10-CM

## 2024-04-26 ENCOUNTER — Ambulatory Visit (INDEPENDENT_AMBULATORY_CARE_PROVIDER_SITE_OTHER): Admitting: *Deleted

## 2024-04-26 ENCOUNTER — Encounter: Payer: Self-pay | Admitting: *Deleted

## 2024-04-26 DIAGNOSIS — F17210 Nicotine dependence, cigarettes, uncomplicated: Secondary | ICD-10-CM

## 2024-04-26 NOTE — Progress Notes (Signed)
 Virtual Visit via Telephone Note  I connected with Kendra Fuller on 04/26/24 at 10:30 AM EDT by telephone and verified that I am speaking with the correct person using two identifiers.  Location: Patient: at home Provider: [address] [place], [place], [address]    I discussed the limitations, risks, security and privacy concerns of performing an evaluation and management service by telephone and the availability of in person appointments. I also discussed with the patient that there may be a patient responsible charge related to this service. The patient expressed understanding and agreed to proceed.   Shared Decision Making Visit Lung Cancer Screening Program [phone])   Eligibility: Age 58 y.o. Pack Years Smoking History Calculation 45 (# packs/per year x # years smoked) Recent History of coughing up blood  no Unexplained weight loss? no ( >Than 15 pounds within the last 6 months ) Prior History Lung / other cancer no (Diagnosis within the last 5 years already requiring surveillance chest CT Scans). Smoking Status Current Smoker Former Smokers: Years since quit: n/a  Quit Date: n/a  Visit Components: Discussion included one or more decision making aids. yes Discussion included risk/benefits of screening. yes Discussion included potential follow up diagnostic testing for abnormal scans. yes Discussion included meaning and risk of over diagnosis. yes Discussion included meaning and risk of False Positives. yes Discussion included meaning of total radiation exposure. yes  Counseling Included: Importance of adherence to annual lung cancer LDCT screening. yes Impact of comorbidities on ability to participate in the program. yes Ability and willingness to under diagnostic treatment. yes  Smoking Cessation Counseling: Current Smokers:  Discussed importance of smoking cessation. yes Information about tobacco cessation classes and interventions provided to patient.  yes Patient provided with ticket for LDCT Scan. no Symptomatic Patient. no  Counseling(Intermediate counseling: > three minutes) [address]406 Diagnosis Code: Tobacco Use Z72.0 Asymptomatic Patient yes  Counseled patient 4 minutes regarding tobacco use.   Former Smokers:  Discussed the importance of maintaining cigarette abstinence. yes Diagnosis Code: Personal History of Nicotine Dependence. S12.108 Information about tobacco cessation classes and interventions provided to patient. Yes Patient provided with ticket for LDCT Scan. no Written Order for Lung Cancer Screening with LDCT placed in Epic. Yes (CT Chest Lung Cancer Screening Low Dose W/O CM) [id number] Z12.2-Screening of respiratory organs Z87.891-Personal history of nicotine dependence   Kendra Speaks, RN

## 2024-04-26 NOTE — Patient Instructions (Signed)

## 2024-05-01 ENCOUNTER — Telehealth: Payer: Self-pay | Admitting: Family

## 2024-05-01 NOTE — Telephone Encounter (Signed)
 Patient has appt 9-26 for labs TSH and needs orders from [facility] [facility].

## 2024-05-02 ENCOUNTER — Ambulatory Visit (HOSPITAL_COMMUNITY)
Admission: RE | Admit: 2024-05-02 | Discharge: 2024-05-02 | Disposition: A | Discharge status: Home / Self Care | Source: Ambulatory Visit | Attending: Acute Care | Admitting: Acute Care

## 2024-05-02 DIAGNOSIS — Z87891 Personal history of nicotine dependence: Secondary | ICD-10-CM | POA: Insufficient documentation

## 2024-05-02 DIAGNOSIS — Z122 Encounter for screening for malignant neoplasm of respiratory organs: Secondary | ICD-10-CM | POA: Insufficient documentation

## 2024-05-02 DIAGNOSIS — F17210 Nicotine dependence, cigarettes, uncomplicated: Secondary | ICD-10-CM | POA: Diagnosis present

## 2024-05-02 NOTE — Telephone Encounter (Signed)
 Reviewed chart with Rosina Epps - patient had TSH drawn on 04/12/24 and it was normal no need to repeat at this time

## 2024-05-03 ENCOUNTER — Other Ambulatory Visit

## 2024-05-08 ENCOUNTER — Ambulatory Visit: Admitting: Family Medicine

## 2024-05-10 ENCOUNTER — Telehealth: Payer: Self-pay | Admitting: *Deleted

## 2024-05-10 NOTE — Telephone Encounter (Signed)
 IMPRESSION: Multiple pulmonary nodules measuring up to 1.2 cm as detailed above. Lung-RADS 4A, suspicious. Follow up low-dose chest CT without contrast in 3 months (please use the following order, CT CHEST LCS NODULE FOLLOW-UP W/O CM) is recommended. Alternatively, PET may be considered when there is a solid component 8mm or larger.   Atherosclerotic calcifications of coronary arteries.   Cholelithiasis.  CT Results reviewed by Dr Kara and he recommends pt get a PET scan and a follow up visit to review PET results.  Attempted to contact pt but had to leave a voicemail for pt to return call.

## 2024-05-13 NOTE — Telephone Encounter (Signed)
 Results also seen by Ruthell, NP, once results reviewed with the patient f/u appt can be with Lauraine.

## 2024-05-14 ENCOUNTER — Other Ambulatory Visit: Payer: Self-pay

## 2024-05-14 DIAGNOSIS — Z87891 Personal history of nicotine dependence: Secondary | ICD-10-CM

## 2024-05-14 DIAGNOSIS — Z122 Encounter for screening for malignant neoplasm of respiratory organs: Secondary | ICD-10-CM

## 2024-05-14 DIAGNOSIS — R911 Solitary pulmonary nodule: Secondary | ICD-10-CM

## 2024-05-14 DIAGNOSIS — F17210 Nicotine dependence, cigarettes, uncomplicated: Secondary | ICD-10-CM

## 2024-05-14 NOTE — Telephone Encounter (Signed)
 I have spoken with the patient and reviewed recent Lung CT results. She is in agreement to complete a PET scan. Order placed. She will follow up with Sarah within 1-2 weeks to review results. Results and plan to PCP.   Isaiah, RN

## 2024-05-22 ENCOUNTER — Telehealth: Payer: Self-pay

## 2024-05-22 ENCOUNTER — Ambulatory Visit: Admitting: Family Medicine

## 2024-05-22 VITALS — BP 142/94 | HR 90 | Ht 67.0 in | Wt 217.0 lb

## 2024-05-22 DIAGNOSIS — M2550 Pain in unspecified joint: Secondary | ICD-10-CM | POA: Diagnosis not present

## 2024-05-22 DIAGNOSIS — M353 Polymyalgia rheumatica: Secondary | ICD-10-CM

## 2024-05-22 LAB — SEDIMENTATION RATE: Sed Rate: 36 mm/h — ABNORMAL HIGH (ref 0–30)

## 2024-05-22 LAB — TSH: TSH: 4.55 u[IU]/mL (ref 0.35–5.50)

## 2024-05-22 LAB — CK: Total CK: 58 U/L (ref 17–177)

## 2024-05-22 MED ORDER — KETOROLAC TROMETHAMINE 60 MG/2ML IM SOLN
60.0000 mg | Freq: Once | INTRAMUSCULAR | Status: AC
  Administered 2024-05-22: 60 mg via INTRAMUSCULAR

## 2024-05-22 MED ORDER — METHYLPREDNISOLONE ACETATE 80 MG/ML IJ SUSP
80.0000 mg | Freq: Once | INTRAMUSCULAR | Status: AC
  Administered 2024-05-22: 80 mg via INTRAMUSCULAR

## 2024-05-22 MED ORDER — PREDNISONE 10 MG (48) PO TBPK: ORAL_TABLET | Freq: Every day | ORAL | 0 refills | Status: DC

## 2024-05-22 NOTE — Progress Notes (Signed)
   LILLETTE Ileana Collet, PhD, LAT, ATC acting as a scribe for Artist Lloyd, MD.  Kendra Fuller is a 58 y.o. female who presents to [facility] at [facility] today for polymyalgias and arthralgias x 1.5 months. She woke up one morning and had an increased in pain in her joints and muscles. Pain is mostly in her knees, legs, shoulder, and elbows. She notes soreness and tightness in her muscles and joints.   Treatments tried: IBU, Tylenol, oral diclofenac , baclofen   Pertinent review of systems: No fevers or chills  Relevant historical information: COPD Hypothyroidism  Exam:  BP (!) 142/94   Pulse 90   Ht 5' 7 (1.702 m)   Wt 217 lb (98.4 kg)   LMP 10/07/2014   SpO2 95%   BMI 33.99 kg/m  General: Well Developed, well nourished, and in no acute distress.   MSK: Tender palpation bilateral trapezius upper arms lateral thighs and hips bilaterally.  Normal motion.  Strength is intact.    Lab and Radiology Results No results found for this or any previous visit (from the past 72 hours). No results found.     Assessment and Plan: 58 y.o. female with diffuse myalgias and arthralgias.  This started about 2 weeks ago without any significant change.  She does have a prescription for atorvastatin  but has been on this medicine for years without dose changes.  This is concerning for polymyalgia rheumatica as well as other rheumatologic or inflammatory conditions.  Plan for metabolic and rheumatologic workup listed below.  Today we will do IM Toradol  and Depo-Medrol  injection and prescribed a tapering course of prednisone  and plan to recheck in about 2 weeks.  If rheumatology condition clearly identified will refer directly to rheumatology.   PDMP not reviewed this encounter. Orders Placed This Encounter  Procedures   ANA    Standing Status:   Future    Number of Occurrences:   1    Expiration Date:   05/22/2025   Cyclic citrul peptide antibody, IgG    Standing  Status:   Future    Number of Occurrences:   1    Expiration Date:   05/22/2025   HLA-B27 antigen    Polyarthalgia    Standing Status:   Future    Number of Occurrences:   1    Expiration Date:   05/22/2025   Rheumatoid factor    Standing Status:   Future    Number of Occurrences:   1    Expiration Date:   05/22/2025   Sedimentation rate    Standing Status:   Future    Number of Occurrences:   1    Expiration Date:   05/22/2025   TSH    Standing Status:   Future    Number of Occurrences:   1    Expiration Date:   05/22/2025   CK (Creatine Kinase)   Meds ordered this encounter  Medications   predniSONE  (STERAPRED UNI-PAK 48 TAB) 10 MG (48) TBPK tablet    Sig: Take by mouth daily. 12 day dosepack po    Dispense:  48 tablet    Refill:  0   ketorolac  (TORADOL ) injection 60 mg   methylPREDNISolone  acetate (DEPO-MEDROL ) injection 80 mg     Discussed warning signs or symptoms. Please see discharge instructions. Patient expresses understanding.   The above documentation has been reviewed and is accurate and complete Artist Lloyd, M.D.

## 2024-05-22 NOTE — Telephone Encounter (Signed)
 Pt cancelled PET and declined rescheduling per facility. Called patient and LVM. Requested call back to reschedule PET and to answer any questions she may have. She has a f/u appt 10/31 with Ruthell, NP to review results. Will cancel appt if scan is not rescheduled.

## 2024-05-22 NOTE — Patient Instructions (Addendum)
 Thank you for coming in today.   Please get labs today before you leave   I've sent a prescription for Prednisone  to your pharmacy.   You received an injection today. Seek immediate medical attention if the joint becomes red, extremely painful, or is oozing fluid.   Check back in 2 week

## 2024-05-23 ENCOUNTER — Encounter (HOSPITAL_COMMUNITY): Payer: Self-pay

## 2024-05-23 ENCOUNTER — Other Ambulatory Visit (HOSPITAL_COMMUNITY)

## 2024-05-24 LAB — ANA: Anti Nuclear Antibody (ANA): NEGATIVE

## 2024-05-24 LAB — HLA-B27 ANTIGEN: HLA-B27 Antigen: NEGATIVE

## 2024-05-24 LAB — RHEUMATOID FACTOR: Rheumatoid fact SerPl-aCnc: <10 [IU]/mL (ref <14)

## 2024-05-24 LAB — CYCLIC CITRUL PEPTIDE ANTIBODY, IGG: Cyclic Citrullin Peptide Ab: <16 U

## 2024-05-27 ENCOUNTER — Ambulatory Visit: Payer: Self-pay | Admitting: Family Medicine

## 2024-05-27 DIAGNOSIS — M353 Polymyalgia rheumatica: Secondary | ICD-10-CM

## 2024-05-27 NOTE — Progress Notes (Signed)
 Rheumatology labs are significant for elevated sedimentation rate.  Everything else is normal.  This would indicate polymyalgia rheumatica.  I am referring you to rheumatology.  You should hear from [facility] rheumatology soon.

## 2024-05-28 ENCOUNTER — Ambulatory Visit: Admitting: Family Medicine

## 2024-05-30 ENCOUNTER — Telehealth: Payer: Self-pay

## 2024-05-30 NOTE — Telephone Encounter (Signed)
 Copied from CRM [id number]. Topic: Appointments - Appointment Info/Confirmation >> May 30, 2024  1:34 PM Kendra Fuller wrote: Patient/patient representative is calling for information regarding an appointment.  Patient called stating she got a referral from the rheumatology and an appointment and she want to know if she need to keep the 10/29 appointment with cory  [phone]

## 2024-05-31 ENCOUNTER — Ambulatory Visit (HOSPITAL_COMMUNITY)
Admission: RE | Admit: 2024-05-31 | Discharge: 2024-05-31 | Disposition: A | Discharge status: Home / Self Care | Source: Ambulatory Visit | Attending: Acute Care | Admitting: Acute Care

## 2024-05-31 DIAGNOSIS — Z122 Encounter for screening for malignant neoplasm of respiratory organs: Secondary | ICD-10-CM | POA: Diagnosis present

## 2024-05-31 DIAGNOSIS — Z87891 Personal history of nicotine dependence: Secondary | ICD-10-CM | POA: Diagnosis present

## 2024-05-31 DIAGNOSIS — R911 Solitary pulmonary nodule: Secondary | ICD-10-CM | POA: Diagnosis present

## 2024-05-31 DIAGNOSIS — F17210 Nicotine dependence, cigarettes, uncomplicated: Secondary | ICD-10-CM | POA: Insufficient documentation

## 2024-05-31 LAB — GLUCOSE, CAPILLARY: Glucose-Capillary: 95 mg/dL (ref 70–99)

## 2024-05-31 MED ORDER — FLUDEOXYGLUCOSE F - 18 (FDG) INJECTION
10.8500 | Freq: Once | INTRAVENOUS | Status: AC | PRN
  Administered 2024-05-31: 10.84 via INTRAVENOUS

## 2024-06-03 ENCOUNTER — Other Ambulatory Visit: Payer: Self-pay | Admitting: Family

## 2024-06-03 ENCOUNTER — Telehealth: Payer: Self-pay | Admitting: Family Medicine

## 2024-06-03 DIAGNOSIS — M545 Low back pain, unspecified: Secondary | ICD-10-CM

## 2024-06-03 DIAGNOSIS — F331 Major depressive disorder, recurrent, moderate: Secondary | ICD-10-CM

## 2024-06-03 DIAGNOSIS — F411 Generalized anxiety disorder: Secondary | ICD-10-CM

## 2024-06-03 NOTE — Telephone Encounter (Signed)
 Patient called and was not sure if she needed to come to her appt on wednesday as she has appt with the rheumatologist already set up. She stated she would still come if he needed her to but she wanted to check to see. Please advise.

## 2024-06-04 NOTE — Telephone Encounter (Signed)
Looks like appointment has been canceled.  

## 2024-06-04 NOTE — Telephone Encounter (Signed)
 Patient can cancel the appointment with me on Wednesday if she has an appoint with rheumatology.

## 2024-06-05 ENCOUNTER — Ambulatory Visit: Admitting: Family Medicine

## 2024-06-07 ENCOUNTER — Ambulatory Visit: Admitting: Acute Care

## 2024-06-10 ENCOUNTER — Other Ambulatory Visit: Payer: Self-pay | Admitting: Family Medicine

## 2024-06-27 ENCOUNTER — Telehealth: Payer: Self-pay | Admitting: *Deleted

## 2024-06-27 NOTE — Telephone Encounter (Signed)
 Copied from CRM [id number]. Topic: Clinical - Lab/Test Results >> Jun 25, 2024  8:47 AM [name] wrote: Reason for CRM: patient is requesting that Kendra Fuller send her pet scan report/results  to her rheumatologist Dr Cindy curt [facility] . Kendra Fuller is her nurse  Fax [phone] Phone [phone]

## 2024-06-27 NOTE — Telephone Encounter (Signed)
 Results faxed to Dr. Randol office.

## 2024-07-22 ENCOUNTER — Other Ambulatory Visit: Payer: Self-pay | Admitting: Family Medicine

## 2024-07-22 NOTE — Telephone Encounter (Signed)
 Short-term medication, pt needs OV for eval of sx prior to refill.

## 2024-08-09 ENCOUNTER — Ambulatory Visit: Payer: Self-pay

## 2024-08-09 ENCOUNTER — Telehealth: Admitting: Family Medicine

## 2024-08-09 ENCOUNTER — Encounter: Payer: Self-pay | Admitting: Family Medicine

## 2024-08-09 DIAGNOSIS — B370 Candidal stomatitis: Secondary | ICD-10-CM | POA: Diagnosis not present

## 2024-08-09 MED ORDER — NYSTATIN 100000 UNIT/ML MT SUSP: 5.0000 mL | Freq: Four times a day (QID) | OROMUCOSAL | 0 refills | Status: AC

## 2024-08-09 NOTE — Progress Notes (Signed)
 "  Virtual Visit via MyChart video note  I connected with Kendra Fuller on 08/09/2024 at 1536 by video and verified that I am speaking with the correct person using two identifiers. Kendra Fuller is currently located at work on break and patient are currently with her during visit. The provider, Fonda LABOR Jeily Guthridge, MD is located in their office at time of visit.  Call ended at 1542  I discussed the limitations, risks, security and privacy concerns of performing an evaluation and management service by video and the availability of in person appointments. I also discussed with the patient that there may be a patient responsible charge related to this service. The patient expressed understanding and agreed to proceed.   History and Present Illness:  Discussed the use of AI scribe software for clinical note transcription with the patient, who gave verbal consent to proceed.  History of Present Illness   Kendra Fuller is a 59 year old female who presents with oral thrush.  Oral thrush - Painful white patches and discharge present on and under the tongue and gums - Difficulty eating due to oral discomfort - No prior history of oral thrush - Use of Listerine mouthwash without significant relief  Arthritis and inflammatory symptoms - On prednisone  for the past four months for arthritis and inflammation - Previous symptoms caused significant difficulty with movement  Medication use - Current medication includes prednisone ; dose and frequency not specified - Has not used inhaler recently         Outpatient Encounter Medications as of 08/09/2024  Medication Sig   nystatin (MYCOSTATIN) 100000 UNIT/ML suspension Take 5 mLs (500,000 Units total) by mouth 4 (four) times daily for 10 days.   aspirin  EC 81 MG tablet Take 1 tablet (81 mg total) by mouth daily.   atorvastatin  (LIPITOR) 20 MG tablet Take 1 tablet (20 mg total) by mouth daily.   baclofen  (LIORESAL ) 10 MG tablet TAKE 1  TABLET BY MOUTH THREE TIMES A DAY   Budeson-Glycopyrrol-Formoterol (BREZTRI  AEROSPHERE) 160-9-4.8 MCG/ACT AERO Inhale 2 puffs into the lungs 2 (two) times daily.   buPROPion  (WELLBUTRIN  XL) 300 MG 24 hr tablet TAKE 1 TABLET BY MOUTH EVERY DAY   busPIRone  (BUSPAR ) 5 MG tablet TAKE 1 TABLET BY MOUTH THREE TIMES A DAY AS NEEDED   Cholecalciferol (VITAMIN D ) 2000 units CAPS Take by mouth.   diclofenac  (VOLTAREN ) 75 MG EC tablet Take 1 tablet (75 mg total) by mouth 2 (two) times daily. (Patient not taking: Reported on 05/22/2024)   escitalopram  (LEXAPRO ) 20 MG tablet TAKE 1 TABLET BY MOUTH EVERY DAY   esomeprazole (NEXIUM) 10 MG packet Take 10 mg by mouth daily before breakfast.   fluticasone  (FLONASE ) 50 MCG/ACT nasal spray Place 2 sprays into both nostrils daily.   levothyroxine  (SYNTHROID ) 100 MCG tablet Take 1 tablet (100 mcg total) by mouth daily.   metoprolol  tartrate (LOPRESSOR ) 25 MG tablet TAKE 1 TABLET EVERY 3 HOURS AS NEEDED FOR PALPITATIONS   predniSONE  (STERAPRED UNI-PAK 48 TAB) 10 MG (48) TBPK tablet TAKE AS DIRECTED ON PACKAGE   No facility-administered encounter medications on file as of 08/09/2024.    Review of Systems  Constitutional:  Negative for chills and fever.  HENT:  Positive for mouth sores.   Eyes:  Negative for redness and visual disturbance.  Respiratory:  Negative for chest tightness and shortness of breath.   Cardiovascular:  Negative for chest pain and leg swelling.  Genitourinary:  Negative for difficulty urinating and  dysuria.  Musculoskeletal:  Negative for back pain and gait problem.  Skin:  Negative for rash.  Neurological:  Negative for light-headedness and headaches.  Psychiatric/Behavioral:  Negative for agitation and behavioral problems.   All other systems reviewed and are negative.   Observations/Objective: Patient is comfortable and in no acute distress  Assessment and Plan: Problem List Items Addressed This Visit   None Visit Diagnoses        Oral thrush    -  Primary   Relevant Medications   nystatin (MYCOSTATIN) 100000 UNIT/ML suspension           Oral candidiasis Likely secondary to prolonged prednisone  use, causing discomfort and difficulty eating. Treatable with antifungal therapy. - Prescribed nystatin liquid, instructed to swish, gargle, and swallow for 7-10 days. - Advised continuation of using mouthwashes like Listerine. - Recommended discussing blood sugar levels with primary care provider.         Follow up plan: Return if symptoms worsen or fail to improve.     I discussed the assessment and treatment plan with the patient. The patient was provided an opportunity to ask questions and all were answered. The patient agreed with the plan and demonstrated an understanding of the instructions.   The patient was advised to call back or seek an in-person evaluation if the symptoms worsen or if the condition fails to improve as anticipated.  The above assessment and management plan was discussed with the patient. The patient verbalized understanding of and has agreed to the management plan. Patient is aware to call the clinic if symptoms persist or worsen. Patient is aware when to return to the clinic for a follow-up visit. Patient educated on when it is appropriate to go to the emergency department.    I provided 6 minutes of non-face-to-face time during this encounter.    Fonda DELENA Levins, MD    "

## 2024-08-09 NOTE — Telephone Encounter (Signed)
Apppt made.

## 2024-08-09 NOTE — Telephone Encounter (Signed)
 FYI Only or Action Required?: FYI only for provider: appointment scheduled on 08/09/24.  Patient was last seen in primary care on 04/12/2024 by Lavell Bari LABOR, FNP.  Called Nurse Triage reporting Thrush.  Symptoms began 2 weeks ago.  Interventions attempted: OTC medications: Orajel and antiseptic mouthwash.  Symptoms are: unchanged.  Triage Disposition: See PCP When Office is Open (Within 3 Days)  Patient/caregiver understands and will follow disposition?: Yes                                  1. SYMPTOM: What's the main symptom you're concerned about? (e.g., chapped lips, dry mouth, lump, sores)     White patches and pain under tongue 2. ONSET: When did the pain start?     2 weeks 3. PAIN: Is there any pain? If Yes, ask: How bad is it? (Scale: 0-10; or none, mild, moderate, severe)     Rates pain a 9 4. CAUSE: What do you think is causing the symptoms?     Thrush, taking prednisone  past 4 months 5. OTHER SYMPTOMS: Do you have any other symptoms? (e.g., fever, sore throat, toothache, swelling)     Burning when swallowing, tongue burns     Denies difficulty breathing, denies fever, denies toothache  Copied from CRM #[id number]. Topic: Clinical - Red Word Triage >> Aug 09, 2024  1:05 PM Wess RAMAN wrote: Red Word that prompted transfer to Nurse Triage: Thrush in mouth that is very painful and is now burning  Would like antibiotic called in  Pharmacy: [id number] - [place], [place] - [address] [place] [address] Phone: [phone] Fax: [phone] Hours: Not open 24 hours  Reason for Disposition  [1] White patches that stick to tongue or inner cheek AND [2] can be wiped off  Protocols used: Mouth Symptoms-A-AH

## 2024-08-19 NOTE — Progress Notes (Unsigned)
 "  Chief Complaint: Microscopic blood in urine   History of Present Illness:  59 yo female here for E/M of persistent microscopic hematuria. Longtime smoker.  She has had no gross hematuria.  She did have a PET CT scan done in October for a lung nodule.  He was found to be normal in appearance.     Past Medical History:  Past Medical History:  Diagnosis Date   Congestion of upper airway    sinus and chest 1 week, fever one nite   GERD (gastroesophageal reflux disease)    occ   Personal history of colonic polyps 10/21/2013   Laparoscopic sigmoid colectomy July 2012  Collected Date: 03/08/2011 Received Date: 03/09/2011 Physician: Redell D. 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 - SURGICAL RESECTION MARGINS, NEGATIVE FOR DYSPLASIA OR MALIGNANCY. - SIXTEEN LYMPH NODES, NEGATIVE FOR TUMOR (0/16). - ENDOSCOPIC TATTOO PRESENT CHAD RUND DO Pathologist, Electronic Signature (Case signed 03/10/2011) Specimen Gross and Clinical Information Specimen(s) Obtained: Colon, segmental resection for tumor, splenic flexor with polyp    Rectal bleeding    Thyroid  disease    hypothyroidism    Past Surgical History:  Past Surgical History:  Procedure Laterality Date   COLON SURGERY     LAPAROSCOPIC SIGMOID COLECTOMY  03/08/2011   OOPHORECTOMY Left 97   also lft tube   TUBAL LIGATION     left fallopian tube removed   VENTRAL HERNIA REPAIR N/A 12/03/2013   Procedure: LAPAROSCOPIC LYSIS OF ADHESIONS AND REPAIR OF INCARCERATED VENTRAL HERNIA WITH MESH;  Surgeon: Elspeth KYM Schultze, MD;  Location: [facility] OR;  Service: General;  Laterality: N/A;    Allergies:  Allergies[1]  Family History:  Family History  Problem Relation Age of Onset   Cancer Mother        lung cancer    Heart disease Father    Breast cancer Neg Hx     Social History:  Social History[2]  Review of symptoms:  Constitutional:  Negative for unexplained weight loss, night sweats, fever, chills ENT:  Negative for nose bleeds, sinus pain, painful swallowing CV:  Negative for chest pain, shortness of breath, exercise intolerance, palpitations, loss of consciousness Resp:  Negative for cough, wheezing, shortness of breath GI:  Negative for nausea, vomiting, diarrhea, bloody stools GU:  Positives noted in HPI; otherwise negative for gross hematuria, dysuria, urinary incontinence Neuro:  Negative for seizures, poor balance, limb weakness, slurred speech Psych:  Negative for lack of energy, depression, anxiety Endocrine:  Negative for polydipsia, polyuria, symptoms of hypoglycemia (dizziness, hunger, sweating) Hematologic:  Negative for anemia, purpura, petechia, prolonged or excessive bleeding, use of anticoagulants  Allergic:  Negative for difficulty breathing or choking as a result of exposure to anything; no shellfish allergy; no allergic response (rash/itch) to materials, foods  Physical exam: LMP 10/07/2014  GENERAL APPEARANCE:  Well appearing, well developed, well nourished, NAD HEENT: Atraumatic, Normocephalic, oropharynx clear. NECK: Supple without lymphadenopathy or thyromegaly. LUNGS: Clear to auscultation bilaterally. HEART: Regular Rate  EXTREMITIES: Moves all extremities well.  Without clubbing, cyanosis, or edema. NEUROLOGIC:  Alert and oriented x 3, normal gait, CN II-XII grossly intact.  MENTAL STATUS:  Appropriate. BACK:  Non-tender to palpation.  No CVAT SKIN:  Warm, dry and intact.    Results: I reviewed PET CT images.  Kidneys appear normal.  No stones.  Urinalysis today revealed persistent microscopic hematuria  Past medical records were reviewed.  Past urinalyses were reviewed  Assessment: Microscopic hematuria, persistent, and a smoker   Plan: 1.  At this  point, I think the PET CT scan would suffice in terms of evaluating her upper tracts along with hematuria protocol cytology that is sent today  2.  I will bring her back for cystoscopy     [1]  Allergies Allergen Reactions   Oxycodone      Hallucinations  [2]  Social History Tobacco Use   Smoking status: Every Day    Current packs/day: 1.50    Average packs/day: 1.5 packs/day for 31.0 years (46.5 ttl pk-yrs)    Types: Cigarettes    Start date: 39    Last attempt to quit: 2000   Smokeless tobacco: Never  Substance Use Topics   Alcohol use: Yes    Comment: occ   Drug use: No   "

## 2024-08-20 ENCOUNTER — Ambulatory Visit: Admitting: Urology

## 2024-08-20 ENCOUNTER — Encounter: Payer: Self-pay | Admitting: Urology

## 2024-08-20 VITALS — BP 132/72 | HR 94

## 2024-08-20 DIAGNOSIS — R3129 Other microscopic hematuria: Secondary | ICD-10-CM | POA: Diagnosis not present

## 2024-08-20 NOTE — Progress Notes (Signed)
 Dianon Hematuria:  Tracking number: [id number]  Pickup details scanned into patient chart

## 2024-08-21 LAB — MICROSCOPIC EXAMINATION

## 2024-08-21 LAB — URINALYSIS, ROUTINE W REFLEX MICROSCOPIC
Bilirubin, UA: NEGATIVE
Glucose, UA: NEGATIVE
Ketones, UA: NEGATIVE
Leukocytes,UA: NEGATIVE
Nitrite, UA: NEGATIVE
Protein,UA: NEGATIVE
Specific Gravity, UA: 1.01 (ref 1.005–1.030)
Urobilinogen, Ur: 0.2 mg/dL (ref 0.2–1.0)
pH, UA: 7.5 (ref 5.0–7.5)

## 2024-08-28 LAB — HEMATURIA PROFILE

## 2024-08-31 ENCOUNTER — Other Ambulatory Visit: Payer: Self-pay | Admitting: Nurse Practitioner

## 2024-09-02 ENCOUNTER — Ambulatory Visit: Payer: Self-pay | Admitting: Urology

## 2024-09-04 ENCOUNTER — Other Ambulatory Visit: Payer: Self-pay | Admitting: Family

## 2024-09-04 DIAGNOSIS — F411 Generalized anxiety disorder: Secondary | ICD-10-CM

## 2024-09-04 DIAGNOSIS — F331 Major depressive disorder, recurrent, moderate: Secondary | ICD-10-CM

## 2024-10-08 ENCOUNTER — Other Ambulatory Visit: Admitting: Urology
